# Patient Record
Sex: Female | Born: 1955
Health system: Southern US, Community
[De-identification: ages and names within clinical notes are randomized; demographics above are authoritative.]

## PROBLEM LIST (undated history)

## (undated) DIAGNOSIS — M6740 Ganglion, unspecified site: Secondary | ICD-10-CM

## (undated) DIAGNOSIS — H409 Unspecified glaucoma: Secondary | ICD-10-CM

## (undated) DIAGNOSIS — T7840XA Allergy, unspecified, initial encounter: Secondary | ICD-10-CM

## (undated) DIAGNOSIS — G43909 Migraine, unspecified, not intractable, without status migrainosus: Secondary | ICD-10-CM

## (undated) DIAGNOSIS — E785 Hyperlipidemia, unspecified: Secondary | ICD-10-CM

## (undated) DIAGNOSIS — M8580 Other specified disorders of bone density and structure, unspecified site: Secondary | ICD-10-CM

## (undated) HISTORY — DX: Ganglion, unspecified site: M67.40

## (undated) HISTORY — DX: Allergy, unspecified, initial encounter: T78.40XA

## (undated) HISTORY — DX: Unspecified glaucoma: H40.9

## (undated) HISTORY — DX: Migraine, unspecified, not intractable, without status migrainosus: G43.909

## (undated) HISTORY — DX: Hyperlipidemia, unspecified: E78.5

## (undated) HISTORY — PX: GANGLION CYST EXCISION: SHX1691

## (undated) HISTORY — DX: Other specified disorders of bone density and structure, unspecified site: M85.80

## (undated) HISTORY — PX: BREAST CYST ASPIRATION: SHX578

---

## 1999-12-01 ENCOUNTER — Other Ambulatory Visit: Admission: RE | Admit: 1999-12-01 | Discharge: 1999-12-01 | Payer: Self-pay | Admitting: Obstetrics and Gynecology

## 2000-01-27 ENCOUNTER — Ambulatory Visit (HOSPITAL_COMMUNITY): Admission: RE | Admit: 2000-01-27 | Discharge: 2000-01-27 | Payer: Self-pay | Admitting: Orthopedic Surgery

## 2000-12-07 ENCOUNTER — Other Ambulatory Visit: Admission: RE | Admit: 2000-12-07 | Discharge: 2000-12-07 | Payer: Self-pay | Admitting: Obstetrics and Gynecology

## 2001-12-20 ENCOUNTER — Other Ambulatory Visit: Admission: RE | Admit: 2001-12-20 | Discharge: 2001-12-20 | Payer: Self-pay | Admitting: Obstetrics and Gynecology

## 2002-12-05 ENCOUNTER — Other Ambulatory Visit: Admission: RE | Admit: 2002-12-05 | Discharge: 2002-12-05 | Payer: Self-pay | Admitting: Obstetrics and Gynecology

## 2003-12-13 ENCOUNTER — Other Ambulatory Visit: Admission: RE | Admit: 2003-12-13 | Discharge: 2003-12-13 | Payer: Self-pay | Admitting: Obstetrics and Gynecology

## 2006-08-19 ENCOUNTER — Ambulatory Visit: Payer: Self-pay | Admitting: Internal Medicine

## 2006-08-19 LAB — CONVERTED CEMR LAB
AST: 29 units/L (ref 0–37)
BUN: 19 mg/dL (ref 6–23)
Bilirubin Urine: NEGATIVE
Calcium: 9.8 mg/dL (ref 8.4–10.5)
Chloride: 99 meq/L (ref 96–112)
Chol/HDL Ratio, serum: 2.4
Cholesterol: 196 mg/dL (ref 0–200)
Creatinine, Ser: 0.9 mg/dL (ref 0.4–1.2)
Eosinophil percent: 3.6 % (ref 0.0–5.0)
HCT: 39.1 % (ref 36.0–46.0)
Hemoglobin, Urine: NEGATIVE
Hemoglobin: 12.8 g/dL (ref 12.0–15.0)
MCHC: 32.9 g/dL (ref 30.0–36.0)
MCV: 95.1 fL (ref 78.0–100.0)
Neutro Abs: 2.2 10*3/uL (ref 1.4–7.7)
Neutrophils Relative %: 48.8 % (ref 43.0–77.0)
RDW: 12.6 % (ref 11.5–14.6)
Total Bilirubin: 1.1 mg/dL (ref 0.3–1.2)
Triglyceride fasting, serum: 36 mg/dL (ref 0–149)
Urine Glucose: NEGATIVE mg/dL

## 2006-09-06 ENCOUNTER — Ambulatory Visit: Payer: Self-pay | Admitting: Internal Medicine

## 2006-10-01 ENCOUNTER — Ambulatory Visit: Payer: Self-pay | Admitting: Internal Medicine

## 2006-10-02 LAB — HM COLONOSCOPY: HM COLON: NORMAL

## 2006-10-26 ENCOUNTER — Ambulatory Visit: Payer: Self-pay | Admitting: Internal Medicine

## 2007-07-02 DIAGNOSIS — Z872 Personal history of diseases of the skin and subcutaneous tissue: Secondary | ICD-10-CM

## 2009-05-07 ENCOUNTER — Encounter: Payer: Self-pay | Admitting: Internal Medicine

## 2009-05-14 ENCOUNTER — Encounter: Payer: Self-pay | Admitting: Internal Medicine

## 2010-12-16 ENCOUNTER — Other Ambulatory Visit: Payer: BLUE CROSS/BLUE SHIELD

## 2010-12-16 ENCOUNTER — Other Ambulatory Visit (INDEPENDENT_AMBULATORY_CARE_PROVIDER_SITE_OTHER): Payer: BLUE CROSS/BLUE SHIELD | Admitting: Internal Medicine

## 2010-12-16 ENCOUNTER — Other Ambulatory Visit: Payer: Self-pay | Admitting: Internal Medicine

## 2010-12-16 ENCOUNTER — Other Ambulatory Visit (INDEPENDENT_AMBULATORY_CARE_PROVIDER_SITE_OTHER): Payer: BLUE CROSS/BLUE SHIELD

## 2010-12-16 DIAGNOSIS — Z0000 Encounter for general adult medical examination without abnormal findings: Secondary | ICD-10-CM

## 2010-12-16 DIAGNOSIS — Z13220 Encounter for screening for lipoid disorders: Secondary | ICD-10-CM

## 2010-12-16 LAB — URINALYSIS, ROUTINE W REFLEX MICROSCOPIC
Hgb urine dipstick: NEGATIVE
Specific Gravity, Urine: 1.02 (ref 1.000–1.030)
Urine Glucose: NEGATIVE
Urobilinogen, UA: 0.2 (ref 0.0–1.0)

## 2010-12-16 LAB — HEPATIC FUNCTION PANEL
ALT: 21 U/L (ref 0–35)
AST: 27 U/L (ref 0–37)
Bilirubin, Direct: 0.1 mg/dL (ref 0.0–0.3)
Total Bilirubin: 1.2 mg/dL (ref 0.3–1.2)

## 2010-12-16 LAB — CBC WITH DIFFERENTIAL/PLATELET
Basophils Absolute: 0 10*3/uL (ref 0.0–0.1)
Eosinophils Relative: 4.7 % (ref 0.0–5.0)
Lymphocytes Relative: 38.6 % (ref 12.0–46.0)
Lymphs Abs: 1.5 10*3/uL (ref 0.7–4.0)
Monocytes Relative: 10.6 % (ref 3.0–12.0)
Neutrophils Relative %: 45.6 % (ref 43.0–77.0)
Platelets: 317 10*3/uL (ref 150.0–400.0)
RDW: 13.7 % (ref 11.5–14.6)
WBC: 4 10*3/uL — ABNORMAL LOW (ref 4.5–10.5)

## 2010-12-16 LAB — LIPID PANEL
Cholesterol: 208 mg/dL — ABNORMAL HIGH (ref 0–200)
Total CHOL/HDL Ratio: 2
Triglycerides: 51 mg/dL (ref 0.0–149.0)
VLDL: 10.2 mg/dL (ref 0.0–40.0)

## 2010-12-16 LAB — BASIC METABOLIC PANEL
CO2: 31 mEq/L (ref 19–32)
Calcium: 9.4 mg/dL (ref 8.4–10.5)
Glucose, Bld: 81 mg/dL (ref 70–99)
Potassium: 4.6 mEq/L (ref 3.5–5.1)
Sodium: 140 mEq/L (ref 135–145)

## 2010-12-16 LAB — TSH: TSH: 2.27 u[IU]/mL (ref 0.35–5.50)

## 2010-12-22 ENCOUNTER — Ambulatory Visit (INDEPENDENT_AMBULATORY_CARE_PROVIDER_SITE_OTHER): Payer: BLUE CROSS/BLUE SHIELD | Admitting: Internal Medicine

## 2010-12-22 ENCOUNTER — Encounter: Payer: Self-pay | Admitting: Internal Medicine

## 2010-12-22 ENCOUNTER — Other Ambulatory Visit: Payer: Self-pay | Admitting: Internal Medicine

## 2010-12-22 VITALS — BP 100/60 | HR 70 | Temp 97.4°F | Wt 112.0 lb

## 2010-12-22 DIAGNOSIS — Z0000 Encounter for general adult medical examination without abnormal findings: Secondary | ICD-10-CM

## 2010-12-22 DIAGNOSIS — Z136 Encounter for screening for cardiovascular disorders: Secondary | ICD-10-CM

## 2010-12-22 MED ORDER — SUMATRIPTAN SUCCINATE 100 MG PO TABS: 100.0000 mg | ORAL_TABLET | Freq: Once | ORAL | Status: DC | PRN

## 2010-12-22 NOTE — Progress Notes (Signed)
Subjective:    Patient ID: Melinda Suarez, female    DOB: [birth year], 55 y.o.   MRN: [id number]  HPI Melinda Suarez presents for an routine medical exam. She was last seen in '07. In the interval she has been healthy. She had been followed by [facility] for gyn who has now retired. She has a life-long history of normal pelvic/pap exams. She has had routine DXA scans and has stable osteopenia. She has had normal Vit D level checked. She has had normal mammograms and is due in May. She had a colonoscopy at age 81 at [facility] which was normal.  Past Medical History  Diagnosis Date  . Ganglion cyst   . Migraine headache    No past surgical history on file. Melinda History  Problem Relation Age of Onset  . Osteoporosis Mother   . Arthritis Mother   . Asthma Brother   . Heart disease Paternal Grandfather   . Stroke Paternal Grandfather   . Diabetes Neg Hx   . Hyperlipidemia Neg Hx   . Hypertension Neg Hx   . Cancer Neg Hx    History   Social History  . Marital Status: Married    Spouse Name: N/A    Number of Children: 1  . Years of Education: 16   Occupational History  . 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 Smoking status: Never Smoker   . Smokeless tobacco: Not on file  . Alcohol Use: 2.5 oz/week    5 drink(s) per week  . Drug Use: Not on file  . Sexually Active: Yes -- Female partner(s)   Other Topics Concern  . Not on file   Social History Narrative   [facility]. Married '80 -  1 dtr - '96 - 10th grade ('12). Work - [employer]. Marriage is in good health. No history of abuse.        Review of Systems Review of Systems  Constitutional:  Negative for fever, chills, activity change and unexpected weight change.  HENT:  Negative for hearing loss, ear pain, congestion, neck stiffness and postnasal drip.   Eyes: Negative for pain, discharge and visual disturbance.  Respiratory: Negative for chest tightness and wheezing.    Cardiovascular: Negative for chest pain and palpitations.       [No decreased exercise tolerance Gastrointestinal: [No change in bowel habit. No bloating or gas. No reflux or indigestion Genitourinary: Negative for urgency, frequency, flank pain and difficulty urinating.  Musculoskeletal: Negative for myalgias, back pain, arthralgias and gait problem.  Neurological: Negative for dizziness, tremors, weakness and headaches.  Hematological: Negative for adenopathy.  Psychiatric/Behavioral: Negative for behavioral problems and dysphoric mood.       Objective:   Physical Exam Physical Exam  Constitutional: She is oriented to person, place, and time. Vital signs are normal. She appears well-developed and well-nourished.       Very pleasant  white woman in no distress  HENT:  Head: Normocephalic and atraumatic.  Right Ear: External ear normal.  Left Ear: External ear normal.  EACs and TMs normal  Eyes: Conjunctivae and EOM are normal. Pupils are equal, round, and reactive to light. No scleral icterus.  Neck: Normal range of motion. Neck supple. No JVD present. No thyromegaly present.  Cardiovascular: Normal rate, regular rhythm and normal heart sounds.  Exam reveals no friction rub.   No murmur heard.      Radial and Dorsalis Pedis pulses  normal  Pulmonary/Chest: Effort normal and breath sounds normal. No respiratory distress. She has no wheezes. She has no rales.       No chest wall deformity with normal A-P diameter. Breast exam: Deferred to gyn - Sept '11  Abdominal: Soft. Bowel sounds are normal. She exhibits no distension and no mass. There is no tenderness. There is no guarding.       No hepatosplenomegaly  Genitourinary:       Exam deferred to gyn - Sept '11 Musculoskeletal: Normal range of motion. She exhibits no edema and no tenderness.       No joint swelling, no synovial thickening, no deformity of the small, medium or large joints.  Lymphadenopathy:    She has no cervical  adenopathy.  Neurological: She is alert and oriented to person, place, and time. She has normal reflexes. No cranial nerve deficit. Coordination normal.       Normal facial symmetry, normal gross motor strength throughout, no tremor or cogwheeling, normal rapid finger movement.  Skin: Skin is warm and dry. No rash noted. No erythema.       No suspicious lesions. Normal turgor. Nails are normal.  Psychiatric: She has a normal mood and affect. Her behavior is normal. Thought content normal.       Normal recall   Lab Results  Component Value Date   WBC 4.0* 12/16/2010   HGB 12.5 12/16/2010   HCT 36.0 12/16/2010   PLT 317.0 12/16/2010   CHOL 208* 12/16/2010   TRIG 51.0 12/16/2010   HDL 86.70 12/16/2010   LDLDIRECT 95.7 12/16/2010   ALT 21 12/16/2010   AST 27 12/16/2010   NA 140 12/16/2010   K 4.6 12/16/2010   CL 103 12/16/2010   CREATININE 0.7 12/16/2010   BUN 20 12/16/2010   CO2 31 12/16/2010   TSH 2.27 12/16/2010          Assessment & Plan:  1. Wellness exam- a very healthy woman with a normal physical exam. Her lab results are all normal with a very good and protective Lipid profile. She is current with Tdap. She had a colonoscopy at age 34 and will be due for follow-up study at age 68. She is current with gyn and breast health as noted. 12 lead EKG reveals right bundle branch block with an RSR' pattern in V1-V2 with associated T-wave inversion. She is directed to [email or web address] for further information should she be curious.  In summary - a very healthy woman who is medically stable. She has osteopenia by previous DXA scans and will be due for follow-up study in September '12. She is advised to consume 1200mg  daily of calcium and [phone] iu Vit D daily. She will return in 1 year (may change her annual exam to September if she desires) or as needed.

## 2011-02-04 ENCOUNTER — Encounter: Payer: Self-pay | Admitting: Internal Medicine

## 2011-06-01 LAB — HM PAP SMEAR

## 2012-08-02 ENCOUNTER — Other Ambulatory Visit: Payer: Self-pay | Admitting: Internal Medicine

## 2013-02-03 LAB — HM MAMMOGRAPHY: HM MAMMO: NORMAL

## 2013-02-03 LAB — HM DEXA SCAN

## 2013-03-21 ENCOUNTER — Encounter: Payer: Self-pay | Admitting: Internal Medicine

## 2014-02-12 LAB — HM MAMMOGRAPHY: HM Mammogram: NORMAL

## 2014-06-04 ENCOUNTER — Encounter: Payer: Self-pay | Admitting: Family Medicine

## 2014-06-04 ENCOUNTER — Ambulatory Visit (INDEPENDENT_AMBULATORY_CARE_PROVIDER_SITE_OTHER): Payer: BLUE CROSS/BLUE SHIELD | Admitting: Family Medicine

## 2014-06-04 VITALS — BP 100/72 | HR 56 | Temp 97.5°F | Wt 112.0 lb

## 2014-06-04 DIAGNOSIS — G43C0 Periodic headache syndromes in child or adult, not intractable: Secondary | ICD-10-CM

## 2014-06-04 DIAGNOSIS — G43909 Migraine, unspecified, not intractable, without status migrainosus: Secondary | ICD-10-CM | POA: Insufficient documentation

## 2014-06-04 DIAGNOSIS — M810 Age-related osteoporosis without current pathological fracture: Secondary | ICD-10-CM | POA: Insufficient documentation

## 2014-06-04 DIAGNOSIS — Z0000 Encounter for general adult medical examination without abnormal findings: Secondary | ICD-10-CM

## 2014-06-04 DIAGNOSIS — IMO0001 Reserved for inherently not codable concepts without codable children: Secondary | ICD-10-CM

## 2014-06-04 DIAGNOSIS — H409 Unspecified glaucoma: Secondary | ICD-10-CM | POA: Insufficient documentation

## 2014-06-04 MED ORDER — SUMATRIPTAN SUCCINATE 100 MG PO TABS: ORAL_TABLET | ORAL | Status: DC

## 2014-06-04 NOTE — Assessment & Plan Note (Signed)
Well controlled with as needed Imitrex. Continue

## 2014-06-04 NOTE — Patient Instructions (Signed)
Return for fasting labs at your convenience over the next few weeks.   I would like to see you at least every 2-3 years unless problems come up which I hope they don't.   Flu shot today.   Medical Records-let's get immunizations, pap smear, dexa bone density report for the last 10 years.   Refilled imitrex

## 2014-06-04 NOTE — Progress Notes (Signed)
Melinda Reddish, MD Phone: [phone]  Subjective:  Patient presents today to establish care with me as their new primary care provider. Patient was formerly a patient of Dr. Linda Hedges. Chief complaint-noted.   Migraines-controlled Sparing use of imitrex sometimes once every 6 months at other times a couple times a month. ROS-no blurry vision, no extremity weakness  Regular exercise-walking everyday for 30 minutes. Does stairs at [employer] 6 flights every hour. Can walk 17 flights. Has ob/gyn-Dr. Pamala Hurry at [facility]. Does mammograms usually in spring, pap smears, dexa every 2 years- osteopenia which progressed to osteoporosis. On Fosamax year 3/5  The following were reviewed and entered/updated in epic: Past Medical History  Diagnosis Date  . Ganglion cyst   . Migraine headache    Patient Active Problem List   Diagnosis Date Noted  . Osteoporosis 06/04/2014    Priority: Medium  . Migraines 06/04/2014    Priority: Medium  . Glaucoma 06/04/2014    Priority: Low  . History of disease of skin and subcutaneous tissue 07/02/2007    Priority: Low   Past Surgical History  Procedure Laterality Date  . Ganglion cyst excision      left hand; left handed    Family History  Problem Relation Age of Onset  . Osteoporosis Mother   . Arthritis Mother   . Asthma Brother   . Heart disease Paternal Grandfather     32s  . Stroke Paternal Grandfather   . Diabetes Neg Hx   . Hyperlipidemia Neg Hx   . Hypertension Neg Hx   . Cancer Neg Hx   . Kidney disease Father     amylodosis, died in 70s. Tear of aorta and was on blood thinners.     Medications- reviewed and updated Current Outpatient Prescriptions  Medication Sig Dispense Refill  . alendronate (FOSAMAX) 70 MG tablet Take 70 mg by mouth once a week. Take with a full glass of water on an empty stomach.      . Multiple Vitamins-Minerals (MULTIVITAMIN,TX-MINERALS) tablet Take 1 tablet by mouth daily.        . SUMAtriptan  (IMITREX) 100 MG tablet TAKE 1 TABLET AS NEEDED FOR MIGRAINE HEADACHE AS DIRECTED.  8 tablet  11  . Bimatoprost (LUMIGAN) 0.01 % SOLN Apply 1 drop to eye daily. Both eyes       . Calcium Carbonate-Vitamin D (CALCIUM + D PO)         No current facility-administered medications for this visit.    Allergies-reviewed and updated No Known Allergies  History   Social History  . Marital Status: Married    Spouse Name: N/A    Number of Children: 1  . Years of Education: 16   Occupational History  . 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 Smoking status: Never Smoker   . Smokeless tobacco: None  . Alcohol Use: 3.5 oz/week    7 drink(s) per week  . Drug Use: No  . Sexual Activity: Yes    Partners: Male   Other Topics Concern  . None   Social History Narrative   Married 1980. 1 dtr - '96 - freshman ('15) has some mental health issues but living at home and going to [facility].       [place]. Business. Engineer, maintenance (IT) for industry.       Hobbies: hike, walking, former runner, crafts, Firefighter, house in [place] with mother. Baking.     ROS--See HPI  Objective: BP 100/72  Pulse 56  Temp(Src) 97.5 F (36.4 C)  Wt 112 lb (50.803 kg) Gen: NAD, resting comfortably, appears younger than stated age HEENT: Mucous membranes are moist. Oropharynx normal. Good dentition CV: RRR no murmurs rubs or gallops Lungs: CTAB no crackles, wheeze, rhonchi Abdomen: soft/nontender/nondistended/normal bowel sounds. No rebound or guarding.  Ext: no edema Skin: warm, dry Neuro: grossly normal, moves all extremities, PERRLA  Assessment/Plan:  Migraines Well controlled with as needed Imitrex. Continue   Return for fasting labs.  Orders Placed This Encounter  Procedures  . CBC    [facility]    Standing Status: Future     Number of Occurrences:      Standing Expiration Date: 07/30/2014  . Comprehensive metabolic panel    [facility]    Standing Status: Future      Number of Occurrences:      Standing Expiration Date: 07/30/2014  . Lipid panel    [facility]    Standing Status: Future     Number of Occurrences:      Standing Expiration Date: 07/30/2014  . TSH    [facility]    Standing Status: Future     Number of Occurrences:      Standing Expiration Date: 07/30/2014    Meds ordered this encounter  Medications  . alendronate (FOSAMAX) 70 MG tablet    Sig: Take 70 mg by mouth once a week. Take with a full glass of water on an empty stomach.  . SUMAtriptan (IMITREX) 100 MG tablet    Sig: TAKE 1 TABLET AS NEEDED FOR MIGRAINE HEADACHE AS DIRECTED.    Dispense:  8 tablet    Refill:  11   From a after visit summary Medical Records-let's get immunizations, pap smear, dexa bone density report for the last 10 years.

## 2014-06-13 ENCOUNTER — Encounter: Payer: Self-pay | Admitting: Family Medicine

## 2014-06-13 DIAGNOSIS — A6000 Herpesviral infection of urogenital system, unspecified: Secondary | ICD-10-CM

## 2014-06-13 DIAGNOSIS — N63 Unspecified lump in unspecified breast: Secondary | ICD-10-CM | POA: Insufficient documentation

## 2014-06-18 LAB — HM PAP SMEAR: HM Pap smear: NORMAL

## 2014-07-16 ENCOUNTER — Other Ambulatory Visit (INDEPENDENT_AMBULATORY_CARE_PROVIDER_SITE_OTHER): Payer: BLUE CROSS/BLUE SHIELD

## 2014-07-16 DIAGNOSIS — IMO0001 Reserved for inherently not codable concepts without codable children: Secondary | ICD-10-CM

## 2014-07-16 DIAGNOSIS — Z0000 Encounter for general adult medical examination without abnormal findings: Secondary | ICD-10-CM

## 2014-07-16 LAB — CBC
HCT: 39.3 % (ref 36.0–46.0)
HEMOGLOBIN: 13 g/dL (ref 12.0–15.0)
MCHC: 33 g/dL (ref 30.0–36.0)
MCV: 94.4 fl (ref 78.0–100.0)
PLATELETS: 376 10*3/uL (ref 150.0–400.0)
RBC: 4.17 Mil/uL (ref 3.87–5.11)
RDW: 14.3 % (ref 11.5–15.5)
WBC: 4.5 10*3/uL (ref 4.0–10.5)

## 2014-07-16 LAB — LIPID PANEL
Cholesterol: 213 mg/dL — ABNORMAL HIGH (ref 0–200)
HDL: 89.2 mg/dL (ref >39.00)
LDL Cholesterol: 115 mg/dL — ABNORMAL HIGH (ref 0–99)
NonHDL: 123.8
TRIGLYCERIDES: 45 mg/dL (ref 0.0–149.0)
Total CHOL/HDL Ratio: 2
VLDL: 9 mg/dL (ref 0.0–40.0)

## 2014-07-16 LAB — COMPREHENSIVE METABOLIC PANEL
ALT: 23 U/L (ref 0–35)
AST: 32 U/L (ref 0–37)
Albumin: 4.6 g/dL (ref 3.5–5.2)
Alkaline Phosphatase: 53 U/L (ref 39–117)
BUN: 20 mg/dL (ref 6–23)
CHLORIDE: 103 meq/L (ref 96–112)
CO2: 26 mEq/L (ref 19–32)
CREATININE: 0.8 mg/dL (ref 0.4–1.2)
Calcium: 9.4 mg/dL (ref 8.4–10.5)
GFR: 79.25 mL/min (ref >60.00)
Glucose, Bld: 85 mg/dL (ref 70–99)
Potassium: 5 mEq/L (ref 3.5–5.1)
Sodium: 141 mEq/L (ref 135–145)
Total Bilirubin: 0.6 mg/dL (ref 0.2–1.2)
Total Protein: 7.7 g/dL (ref 6.0–8.3)

## 2014-07-18 ENCOUNTER — Encounter: Payer: Self-pay | Admitting: Family Medicine

## 2014-07-18 DIAGNOSIS — E785 Hyperlipidemia, unspecified: Secondary | ICD-10-CM | POA: Insufficient documentation

## 2014-07-18 LAB — TSH: TSH: 3.59 u[IU]/mL (ref 0.35–4.50)

## 2015-06-13 ENCOUNTER — Encounter: Payer: Self-pay | Admitting: Family Medicine

## 2015-06-13 ENCOUNTER — Ambulatory Visit (INDEPENDENT_AMBULATORY_CARE_PROVIDER_SITE_OTHER): Payer: BC Managed Care – PPO | Admitting: Family Medicine

## 2015-06-13 VITALS — BP 90/62 | HR 80 | Temp 98.4°F | Ht 65.0 in | Wt 111.0 lb

## 2015-06-13 DIAGNOSIS — Z23 Encounter for immunization: Secondary | ICD-10-CM | POA: Diagnosis not present

## 2015-06-13 DIAGNOSIS — D485 Neoplasm of uncertain behavior of skin: Secondary | ICD-10-CM | POA: Diagnosis not present

## 2015-06-13 DIAGNOSIS — Z0000 Encounter for general adult medical examination without abnormal findings: Secondary | ICD-10-CM

## 2015-06-13 MED ORDER — SUMATRIPTAN SUCCINATE 100 MG PO TABS: ORAL_TABLET | ORAL | Status: DC

## 2015-06-13 NOTE — Patient Instructions (Addendum)
Flu shot received today.  Sign release of information at the front desk for [facility]/Dr. Gareth Eagle for your mammogram  We will call you within a week about your referral to dermatology. If you do not hear within 2 weeks, give [place] a call.   OK for daughter to be seen within next few months- Christmas break would be fine. Not during Christmas week or the Friday before.   1 year unless you need [place] sooner

## 2015-06-13 NOTE — Progress Notes (Signed)
Melinda Reddish, MD Phone: [phone]  Subjective:  Patient presents today for their annual physical. Chief complaint-noted.   -doing very well.  ROS- full  review of systems was completed and negative except for 2 skin lesions she is concerned about- wants to see dermatology  The following were reviewed and entered/updated in epic: Past Medical History  Diagnosis Date  . Ganglion cyst   . Migraine headache    Patient Active Problem List   Diagnosis Date Noted  . Hyperlipidemia 07/18/2014    Priority: Medium  . Osteoporosis 06/04/2014    Priority: Medium  . Migraines 06/04/2014    Priority: Medium  . Genital herpes 06/13/2014    Priority: Low  . Glaucoma 06/04/2014    Priority: Low  . History of disease of skin and subcutaneous tissue 07/02/2007    Priority: Low   Past Surgical History  Procedure Laterality Date  . Ganglion cyst excision      left hand; left handed  . Breast cyst aspiration      benign in 2011    Family History  Problem Relation Age of Onset  . Osteoporosis Mother   . Arthritis Mother   . Asthma Brother   . Heart disease Paternal Grandfather     66s  . Stroke Paternal Grandfather   . Diabetes Neg Hx   . Hyperlipidemia Neg Hx   . Hypertension Neg Hx   . Cancer Neg Hx   . Kidney disease Father     amylodosis, died in 11s. Tear of aorta and was on blood thinners.     Medications- reviewed and updated Current Outpatient Prescriptions  Medication Sig Dispense Refill  . alendronate (FOSAMAX) 70 MG tablet Take 70 mg by mouth once a week. Take with a full glass of water on an empty stomach.    . Bimatoprost (LUMIGAN) 0.01 % SOLN Apply 1 drop to eye daily. Both eyes     . Calcium Carbonate-Vitamin D (CALCIUM + D PO)      . Multiple Vitamins-Minerals (MULTIVITAMIN,TX-MINERALS) tablet Take 1 tablet by mouth daily.      . SUMAtriptan (IMITREX) 100 MG tablet TAKE 1 TABLET AS NEEDED FOR MIGRAINE HEADACHE AS DIRECTED. 8 tablet 11   No current  facility-administered medications for this visit.    Allergies-reviewed and updated Allergies  Allergen Reactions  . Cephalosporins   . Sulfur     Social History   Social History  . Marital Status: Married    Spouse Name: N/A  . Number of Children: 1  . Years of Education: 16   Occupational History  . 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 Smoking status: Never Smoker   . Smokeless tobacco: None  . Alcohol Use: 3.5 oz/week    7 drink(s) per week  . Drug Use: No  . Sexual Activity:    Partners: Male   Other Topics Concern  . None   Social History Narrative   Married 1980. 1 dtr - '96 - freshman ('15) has some mental health issues but living at home and going to [employer] [facility].       [place]. Business. Engineer, maintenance (IT) for industry.       Hobbies: hike, walking, former runner, crafts, Firefighter, house in [place] with mother. Baking.     ROS--See HPI   Objective: BP 90/62 mmHg  Pulse 80  Temp(Src) 98.4 F (36.9 C)  Ht 5\' 5"  (1.651 m)  Wt 111 lb (50.349  kg)  BMI 18.47 kg/m2 Gen: NAD, resting comfortably HEENT: Mucous membranes are moist. Oropharynx normal Neck: no thyromegaly CV: RRR no murmurs rubs or gallops Lungs: CTAB no crackles, wheeze, rhonchi Abdomen: soft/nontender/nondistended/normal bowel sounds. No rebound or guarding.  Breast and GYn exam through Dr. Pamala Hurry Ext: no edema Skin: warm, dry Neuro: grossly normal, moves all extremities, PERRLA  Assessment/Plan:  59 y.o. female presenting for annual physical.  Health Maintenance counseling: 1. Anticipatory guidance: Patient counseled regarding regular dental exams, regular eye exams, wearing seatbelts, wearing sunscreen 2. Risk factor reduction:  Advised patient of need for regular exercise and diet rich and fruits and vegetables to reduce risk of heart attack and stroke.  3. Immunizations/screenings/ancillary studies Health Maintenance Due  Topic Date Due  . Hepatitis  C Screening-declined 02[birth year]25[birth year]56  . HIV Screening already screened 10/28/1970  . PAP SMEAR - pap 06/18/14 negative for malignancy or premalignant lesion 05/31/2014  . INFLUENZA VACCINE - today 04/01/2015  4. Cervical cancer screening- pap 06/18/14 negative for malignancy or premalignant lesion 5. Breast cancer screening-  breast exam with GYN and mammogram within 6 months 6. Colon cancer screening - 2008 with 10 year repeat in February 2018 7. Skin cancer screening- will refer to dermatology- has 2 lesions that may be benign but have some recent change. Left lip, left shoulder  Osteoporosis- compliant with Fosamax since 2012, DEXA 2014 through [facility]. Bone density later this year Migraines- rare imitrex, HA vary from 2-3 a month to 2x a year  1 year CPE  Orders Placed This Encounter  Procedures  . Flu Vaccine QUAD 36+ mos IM  . Ambulatory referral to Dermatology    Referral Priority:  Routine    Referral Type:  Consultation    Referral Reason:  Specialty Services Required    Requested Specialty:  Dermatology    Number of Visits Requested:  1   Meds ordered this encounter  Medications  . SUMAtriptan (IMITREX) 100 MG tablet    Sig: TAKE 1 TABLET AS NEEDED FOR MIGRAINE HEADACHE AS DIRECTED.    Dispense:  8 tablet    Refill:  11

## 2015-06-14 ENCOUNTER — Other Ambulatory Visit (INDEPENDENT_AMBULATORY_CARE_PROVIDER_SITE_OTHER): Payer: BC Managed Care – PPO

## 2015-06-14 DIAGNOSIS — Z0000 Encounter for general adult medical examination without abnormal findings: Secondary | ICD-10-CM

## 2015-06-14 LAB — POCT URINALYSIS DIPSTICK
BILIRUBIN UA: NEGATIVE
GLUCOSE UA: NEGATIVE
Ketones, UA: NEGATIVE
Leukocytes, UA: NEGATIVE
NITRITE UA: NEGATIVE
Protein, UA: NEGATIVE
RBC UA: NEGATIVE
SPEC GRAV UA: 1.02
Urobilinogen, UA: 0.2
pH, UA: 6.5

## 2015-06-14 LAB — CBC WITH DIFFERENTIAL/PLATELET
BASOS ABS: 0 10*3/uL (ref 0.0–0.1)
Basophils Relative: 0.6 % (ref 0.0–3.0)
Eosinophils Absolute: 0.3 10*3/uL (ref 0.0–0.7)
Eosinophils Relative: 4.1 % (ref 0.0–5.0)
HEMATOCRIT: 39.6 % (ref 36.0–46.0)
HEMOGLOBIN: 13.1 g/dL (ref 12.0–15.0)
LYMPHS PCT: 25.7 % (ref 12.0–46.0)
Lymphs Abs: 1.7 10*3/uL (ref 0.7–4.0)
MCHC: 33.1 g/dL (ref 30.0–36.0)
MCV: 94.6 fl (ref 78.0–100.0)
MONOS PCT: 10.8 % (ref 3.0–12.0)
Monocytes Absolute: 0.7 10*3/uL (ref 0.1–1.0)
Neutro Abs: 3.9 10*3/uL (ref 1.4–7.7)
Neutrophils Relative %: 58.8 % (ref 43.0–77.0)
Platelets: 354 10*3/uL (ref 150.0–400.0)
RBC: 4.19 Mil/uL (ref 3.87–5.11)
RDW: 13.3 % (ref 11.5–15.5)
WBC: 6.6 10*3/uL (ref 4.0–10.5)

## 2015-06-14 LAB — HEPATIC FUNCTION PANEL
ALBUMIN: 4.3 g/dL (ref 3.5–5.2)
ALK PHOS: 53 U/L (ref 39–117)
ALT: 24 U/L (ref 0–35)
AST: 27 U/L (ref 0–37)
Bilirubin, Direct: 0.1 mg/dL (ref 0.0–0.3)
TOTAL PROTEIN: 7.6 g/dL (ref 6.0–8.3)
Total Bilirubin: 0.6 mg/dL (ref 0.2–1.2)

## 2015-06-14 LAB — LIPID PANEL
CHOL/HDL RATIO: 2
Cholesterol: 186 mg/dL (ref 0–200)
HDL: 79.4 mg/dL (ref >39.00)
LDL CALC: 94 mg/dL (ref 0–99)
NonHDL: 106.45
TRIGLYCERIDES: 62 mg/dL (ref 0.0–149.0)
VLDL: 12.4 mg/dL (ref 0.0–40.0)

## 2015-06-14 LAB — BASIC METABOLIC PANEL
BUN: 20 mg/dL (ref 6–23)
CALCIUM: 9.5 mg/dL (ref 8.4–10.5)
CO2: 32 mEq/L (ref 19–32)
CREATININE: 0.88 mg/dL (ref 0.40–1.20)
Chloride: 99 mEq/L (ref 96–112)
GFR: 69.75 mL/min (ref >60.00)
GLUCOSE: 84 mg/dL (ref 70–99)
Potassium: 4.9 mEq/L (ref 3.5–5.1)
Sodium: 139 mEq/L (ref 135–145)

## 2015-06-14 LAB — TSH: TSH: 2.32 u[IU]/mL (ref 0.35–4.50)

## 2015-09-05 ENCOUNTER — Telehealth: Payer: Self-pay | Admitting: Family Medicine

## 2015-09-05 NOTE — Telephone Encounter (Signed)
Patient Name: Melinda Suarez  DOB: 03-19-1956    Initial Comment caller states she is still breaking out in hives and a rash after taking abx   Nurse Assessment  Nurse: Mallie Mussel, RN, Alveta Heimlich Date/Time Eilene Ghazi Time): 09/05/2015 8:44:24 AM  Confirm and document reason for call. If symptomatic, describe symptoms. ---Caller states that she was on an Amoxicillin for strep. She began breaking out in a rash. She was diagnosed as allergic to PCN. She was put on Prednisone. The rash began getting better. On the 4th day, she began getting a new rash. The rash continued to spread then began getting better. Now its getting worse again. The new rash looks the same as the hives she was diagnosed with earlier and it is itchy which is moderate. Denies difficulty breathing and denies fever. She stopped the Prednisone on 08/26/15. She has not been taking Benadryl due to glaucoma, but she has been taking Claritin which hasn't helped with the itching.  Has the patient traveled out of the country within the last 30 days? ---No  Does the patient have any new or worsening symptoms? ---Yes  Will a triage be completed? ---Yes  Related visit to physician within the last 2 weeks? ---No  Does the PT have any chronic conditions? (i.e. diabetes, asthma, etc.) ---Yes  List chronic conditions. ---Glaucoma, Osteoporosis  Is this a behavioral health or substance abuse call? ---No     Guidelines    Guideline Title Affirmed Question Affirmed Notes  Hives [1] MODERATE-SEVERE hives persist (i.e., hives interfere with normal activities or work) AND [2] taking antihistamine (e.g., Benadryl, Claritin) > 24 hours    Final Disposition User   See Physician within [facility], RN, Alveta Heimlich    Comments  Appointment scheduled for tomorrow at 11:30a   Referrals  REFERRED TO PCP OFFICE   Disagree/Comply: Leta Baptist

## 2015-09-05 NOTE — Telephone Encounter (Signed)
noted 

## 2015-09-06 ENCOUNTER — Ambulatory Visit (INDEPENDENT_AMBULATORY_CARE_PROVIDER_SITE_OTHER): Payer: BC Managed Care – PPO | Admitting: Family Medicine

## 2015-09-06 ENCOUNTER — Encounter: Payer: Self-pay | Admitting: Family Medicine

## 2015-09-06 VITALS — BP 100/60 | HR 80 | Wt 112.0 lb

## 2015-09-06 DIAGNOSIS — T7840XA Allergy, unspecified, initial encounter: Secondary | ICD-10-CM | POA: Diagnosis not present

## 2015-09-06 DIAGNOSIS — R21 Rash and other nonspecific skin eruption: Secondary | ICD-10-CM

## 2015-09-06 NOTE — Progress Notes (Signed)
Garret Reddish, MD  Subjective:  Melinda Suarez is a 60 y.o. year old very pleasant female patient who presents for/with See problem oriented charting ROS-not ill appearing, no fever/chills. Has taken new medicine which is likely culprit. Not immunocompromised. No mucus membrane involvement. No lip or tongue swelling. No shortness of breath.   Past Medical History-  Patient Active Problem List   Diagnosis Date Noted  . Hyperlipidemia 07/18/2014    Priority: Medium  . Osteoporosis 06/04/2014    Priority: Medium  . Migraines 06/04/2014    Priority: Medium  . Genital herpes 06/13/2014    Priority: Low  . Glaucoma 06/04/2014    Priority: Low  . History of disease of skin and subcutaneous tissue 07/02/2007    Priority: Low    Medications- reviewed and updated Current Outpatient Prescriptions  Medication Sig Dispense Refill  . alendronate (FOSAMAX) 70 MG tablet Take 70 mg by mouth once a week. Take with a full glass of water on an empty stomach.    . Bimatoprost (LUMIGAN) 0.01 % SOLN Apply 1 drop to eye daily. Both eyes     . Calcium Carbonate-Vitamin D (CALCIUM + D PO)      . Multiple Vitamins-Minerals (MULTIVITAMIN,TX-MINERALS) tablet Take 1 tablet by mouth daily.      . SUMAtriptan (IMITREX) 100 MG tablet TAKE 1 TABLET AS NEEDED FOR MIGRAINE HEADACHE AS DIRECTED. 8 tablet 11   No current facility-administered medications for this visit.    Objective: BP 100/60 mmHg  Pulse 80  Wt 112 lb (50.803 kg) Gen: NAD, resting comfortably No lip or tongue swelling CV: RRR no murmurs rubs or gallops Lungs: CTAB no crackles, wheeze, rhonchi Abdomen: soft/nontender/nondistended/normal bowel sounds. No rebound or guarding.  Ext: no edema Skin: warm, dry She has mild erythematous macules throughout her body from neck down to legs sparing palms and soles. No hives noted.  Neuro: grossly normal, moves all extremities  Assessment/Plan:  S:Went to Urgent care for strep throat. Treated  with amoxicillin. She soon developed a rash from her neck down essentially. Went back in and on December 23rd started Prednisone  (20mg  5 days) plus given depo medrol. She developed a new rash with pruritic hives. She Took 4 prednisone and stopped due to worsening rash. Claritin has helped with itch mildly. Has not taken Benadryl no due to glaucoma. Describes current rash as "everywhere neck down" Over last day or two it seems to be lightening and itch less intense. It has been over 10 days since she stopped the medicines. She has erythematous patches. No lip or tongue swelling A/P:Rash/Allergic reaction- added penicillin to allergies under rash. She had not told urgent care about cephalosporin allergy. Added depo medrol and prednisone to allergy as hives (previously raised wheals- now just macules). Given duration of symptoms, I suspect that the lingering depo-medrol is the real culprit here. We discussed allergist referral vs. Waiting 5 weeks for depo to fully be out of system. She would ike to wait. We called Dr. Geryl Rankins optho. Patient does not have closed angle glaucoma and may take benadryl  if she would  like. We will call her with this information next week.    If symptoms persist at 5 weeks- agrees to call for allergist referral Return precautions advised.

## 2015-09-06 NOTE — Patient Instructions (Signed)
Rash likely due to depo-medrol Takes 1-5 weeks to fully clear out of system. Would consider allergist if 4-5 weeks from injection you are still having rash.   If any tongue swelling, shortness of breath, throat swelling develop we need to see you immediately  We called eye doctor and if they call us back we will let you know if benadryl is ok for you. Topical benadryl should be ok but would use on small area first to make sure no reaction

## 2016-03-05 LAB — HM MAMMOGRAPHY

## 2016-07-16 LAB — HM DEXA SCAN: HM Dexa Scan: UNDETERMINED

## 2016-08-13 ENCOUNTER — Other Ambulatory Visit (INDEPENDENT_AMBULATORY_CARE_PROVIDER_SITE_OTHER): Payer: BC Managed Care – PPO

## 2016-08-13 DIAGNOSIS — Z0000 Encounter for general adult medical examination without abnormal findings: Secondary | ICD-10-CM | POA: Diagnosis not present

## 2016-08-13 LAB — POC URINALSYSI DIPSTICK (AUTOMATED)
BILIRUBIN UA: NEGATIVE
Blood, UA: NEGATIVE
Glucose, UA: NEGATIVE
KETONES UA: NEGATIVE
NITRITE UA: NEGATIVE
Spec Grav, UA: 1.015
Urobilinogen, UA: 0.2
pH, UA: 7

## 2016-08-13 LAB — TSH: TSH: 2.48 u[IU]/mL (ref 0.35–4.50)

## 2016-08-13 LAB — LIPID PANEL
CHOLESTEROL: 221 mg/dL — AB (ref 0–200)
HDL: 98.9 mg/dL (ref >39.00)
LDL Cholesterol: 111 mg/dL — ABNORMAL HIGH (ref 0–99)
NONHDL: 122.54
Total CHOL/HDL Ratio: 2
Triglycerides: 59 mg/dL (ref 0.0–149.0)
VLDL: 11.8 mg/dL (ref 0.0–40.0)

## 2016-08-13 LAB — HEPATIC FUNCTION PANEL
ALBUMIN: 4.7 g/dL (ref 3.5–5.2)
ALT: 23 U/L (ref 0–35)
AST: 27 U/L (ref 0–37)
Alkaline Phosphatase: 56 U/L (ref 39–117)
Bilirubin, Direct: 0.1 mg/dL (ref 0.0–0.3)
TOTAL PROTEIN: 7.2 g/dL (ref 6.0–8.3)
Total Bilirubin: 0.6 mg/dL (ref 0.2–1.2)

## 2016-08-13 LAB — BASIC METABOLIC PANEL
BUN: 15 mg/dL (ref 6–23)
CO2: 35 meq/L — AB (ref 19–32)
Calcium: 9.6 mg/dL (ref 8.4–10.5)
Chloride: 101 mEq/L (ref 96–112)
Creatinine, Ser: 0.85 mg/dL (ref 0.40–1.20)
GFR: 72.32 mL/min (ref >60.00)
GLUCOSE: 79 mg/dL (ref 70–99)
POTASSIUM: 4.9 meq/L (ref 3.5–5.1)
SODIUM: 140 meq/L (ref 135–145)

## 2016-08-14 LAB — CBC WITH DIFFERENTIAL/PLATELET
Basophils Absolute: 0 10*3/uL (ref 0.0–0.1)
Basophils Relative: 0.6 % (ref 0.0–3.0)
EOS PCT: 9.5 % — AB (ref 0.0–5.0)
Eosinophils Absolute: 0.4 10*3/uL (ref 0.0–0.7)
HCT: 38.2 % (ref 36.0–46.0)
Hemoglobin: 13 g/dL (ref 12.0–15.0)
LYMPHS ABS: 1.6 10*3/uL (ref 0.7–4.0)
LYMPHS PCT: 38.3 % (ref 12.0–46.0)
MCHC: 33.9 g/dL (ref 30.0–36.0)
MCV: 93.7 fl (ref 78.0–100.0)
MONOS PCT: 10.1 % (ref 3.0–12.0)
Monocytes Absolute: 0.4 10*3/uL (ref 0.1–1.0)
NEUTROS PCT: 41.5 % — AB (ref 43.0–77.0)
Neutro Abs: 1.7 10*3/uL (ref 1.4–7.7)
Platelets: 334 10*3/uL (ref 150.0–400.0)
RBC: 4.08 Mil/uL (ref 3.87–5.11)
RDW: 13.9 % (ref 11.5–15.5)
WBC: 4.1 10*3/uL (ref 4.0–10.5)

## 2016-08-20 ENCOUNTER — Ambulatory Visit (INDEPENDENT_AMBULATORY_CARE_PROVIDER_SITE_OTHER): Payer: BC Managed Care – PPO | Admitting: Family Medicine

## 2016-08-20 ENCOUNTER — Encounter: Payer: Self-pay | Admitting: Family Medicine

## 2016-08-20 VITALS — BP 100/72 | HR 72 | Temp 97.6°F | Ht 64.75 in | Wt 108.4 lb

## 2016-08-20 DIAGNOSIS — F32A Depression, unspecified: Secondary | ICD-10-CM

## 2016-08-20 DIAGNOSIS — F320 Major depressive disorder, single episode, mild: Secondary | ICD-10-CM

## 2016-08-20 DIAGNOSIS — Z0000 Encounter for general adult medical examination without abnormal findings: Secondary | ICD-10-CM | POA: Diagnosis not present

## 2016-08-20 DIAGNOSIS — Z636 Dependent relative needing care at home: Secondary | ICD-10-CM | POA: Insufficient documentation

## 2016-08-20 MED ORDER — SUMATRIPTAN SUCCINATE 100 MG PO TABS: ORAL_TABLET | ORAL | 11 refills | Status: DC

## 2016-08-20 NOTE — Progress Notes (Signed)
Phone: [phone]  Subjective:  Patient presents today for their annual physical. Chief complaint-noted.   See problem oriented charting- ROS- full  review of systems was completed and negative except for: depressed mood, anhedonia, poor energy, trouble with focus.   The following were reviewed and entered/updated in epic: Past Medical History:  Diagnosis Date  . Ganglion cyst   . Migraine headache    Patient Active Problem List   Diagnosis Date Noted  . Mild depression ([place]) 08/20/2016    Priority: Medium  . Hyperlipidemia 07/18/2014    Priority: Medium  . Osteoporosis 06/04/2014    Priority: Medium  . Migraines 06/04/2014    Priority: Medium  . Genital herpes 06/13/2014    Priority: Low  . Glaucoma 06/04/2014    Priority: Low  . History of disease of skin and subcutaneous tissue 07/02/2007    Priority: Low   Past Surgical History:  Procedure Laterality Date  . BREAST CYST ASPIRATION     benign in 2011  . GANGLION CYST EXCISION     left hand; left handed    Family History  Problem Relation Age of Onset  . Osteoporosis Mother   . Arthritis Mother   . Asthma Brother   . Heart disease Paternal Grandfather     26s  . Stroke Paternal Grandfather   . Diabetes Neg Hx   . Hyperlipidemia Neg Hx   . Hypertension Neg Hx   . Cancer Neg Hx   . Kidney disease Father     amylodosis, died in 28s. Tear of aorta and was on blood thinners.     Medications- reviewed and updated Current Outpatient Prescriptions  Medication Sig Dispense Refill  . Calcium Carbonate-Vitamin D (CALCIUM + D PO)      . Multiple Vitamins-Minerals (MULTIVITAMIN,TX-MINERALS) tablet Take 1 tablet by mouth daily.      . SUMAtriptan (IMITREX) 100 MG tablet TAKE 1 TABLET AS NEEDED FOR MIGRAINE HEADACHE AS DIRECTED. 8 tablet 11  . Travoprost, BAK Free, (TRAVATAN) 0.004 % SOLN ophthalmic solution Place 1 drop into both eyes at bedtime.     No current facility-administered medications for this visit.       Allergies-reviewed and updated Allergies  Allergen Reactions  . Depo-Medrol [Methylprednisolone] Hives  . Prednisone Hives  . Sulfur   . Cephalosporins Rash  . Penicillins Rash    Social History   Social History  . Marital status: Married    Spouse name: N/A  . Number of children: 1  . Years of education: 22   Occupational History  . 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 Smoking status: Never Smoker  . Smokeless tobacco: None  . Alcohol use 3.5 oz/week    7 drink(s) per week  . Drug use: No  . Sexual activity: Yes    Partners: Male   Other Topics Concern  . None   Social History Narrative   Married 1980. 1 dtr - '96 - freshman ('15) has some mental health issues but living at home and going to [employer] [facility].       [place]. Business. Engineer, maintenance (IT) for industry.       Hobbies: hike, walking, former runner, crafts, Firefighter, house in [place] with mother. Baking.     Objective: BP 100/72 (BP Location: Left Arm, Patient Position: Sitting, Cuff Size: Normal)   Pulse 72   Temp 97.6 F (36.4 C) (Oral)   Ht 5' 4.75" (1.645 m)  Wt 108 lb 6.4 oz (49.2 kg)   SpO2 97%   BMI 18.18 kg/m j Gen: NAD, resting comfortably HEENT: Mucous membranes are moist. Oropharynx normal Neck: no thyromegaly CV: RRR no murmurs rubs or gallops Lungs: CTAB no crackles, wheeze, rhonchi Abdomen: soft/nontender/nondistended/normal bowel sounds. No rebound or guarding. thin  Ext: no edema Skin: warm, dry Neuro: grossly normal, moves all extremities, PERRLA  Assessment/Plan:  60 y.o. female presenting for annual physical.  Health Maintenance counseling: 1. Anticipatory guidance: Patient counseled regarding regular dental exams, eye exams, wearing seatbelts.  2. Risk factor reduction:  Advised patient of need for regular exercise and diet rich and fruits and vegetables to reduce risk of heart attack and stroke. With all her stressors, exercise has been  down. Advise dwould not advise weight loss 3. Immunizations/screenings/ancillary studies Immunization History  Administered Date(s) Administered  . Influenza,inj,Quad PF,36+ Mos 06/13/2015  . Influenza-Unspecified 06/07/2013, 06/30/2016  . Td 09/01/2001  . Tdap 06/02/2012   Health Maintenance Due  Topic Date Due  . ZOSTAVAX - will wait for shingrix 10/29/2015  . MAMMOGRAM - getting copy- had this year 02/13/2016  . INFLUENZA VACCINE - already had 03/31/2016   4. Cervical cancer screening- 06/18/14 normal with 3 year follow up with gyn 5. Breast cancer screening-  breast exam with GYN and mammogram - as above 6. Colon cancer screening - 2008 with 10 year repeat- will set up next year 7. Skin cancer screening- referred to dermatology last year - no new concerns per patient  Status of chronic or acute concerns  Osteoporosis- on fosamax since 2012, dexa 2014 and again  This year- will get records to review. GYN seems to be managing fosamax   Migraines- imitrex prn. May be as infrequent as twice a year or up to 2-3x a month. Has imitrex on hand- some increased frequency with recent stress  HLD- very mild but excellent HDL- 1.6% 10 year risk- remain off statin  Mild depression ([place]) S:April mother in law diagnosed with bone cancer. In June, mom with heart attack at age 73, now doing well. Uncle with parkinsons had to start in home care- she is power of attorney- spending all her vacation days- transitioned to hospice. husband's dad died in 04-09-2023, family tension. In 05/10/23, husband's mother died. Uncle later died- executor of estate. Very busy with work. Feels very tired- not sure if this is "blue tired" or just tired from all that's going. phq9 of 8 including anhedonia and depressed mood but no SI A/P: discussed medication vs. Counseling and at her level of depression advised counseling- handout given. Follow up 6 weeks.   6 weeks  Meds ordered this encounter  Medications  . Travoprost,  BAK Free, (TRAVATAN) 0.004 % SOLN ophthalmic solution    Sig: Place 1 drop into both eyes at bedtime.  . SUMAtriptan (IMITREX) 100 MG tablet    Sig: TAKE 1 TABLET AS NEEDED FOR MIGRAINE HEADACHE AS DIRECTED.    Dispense:  8 tablet    Refill:  11    Return precautions advised.   Garret Reddish, MD

## 2016-08-20 NOTE — Assessment & Plan Note (Signed)
S:April mother in law diagnosed with bone cancer. In June, mom with heart attack at age 60, now doing well. Uncle with parkinsons had to start in home care- she is power of attorney- spending all her vacation days- transitioned to hospice. husband's dad died in 04-19-23, family tension. In 05/20/2023, husband's mother died. Uncle later died- executor of estate. Very busy with work. Feels very tired- not sure if this is "blue tired" or just tired from all that's going. phq9 of 8 including anhedonia and depressed mood but no SI A/P: discussed medication vs. Counseling and at her level of depression advised counseling- handout given. Follow up 6 weeks.

## 2016-08-20 NOTE — Patient Instructions (Addendum)
Sign release of information at the check out desk for last mammogram and bone density from [facility] with counseling with behavioral health for mild depression. Follow up in 6-8 weeks to recheck on fatigue and depression symptoms  Restart your exercise, at same time- want to make sure you do not lose any weight (slight weight gain would be good)

## 2016-10-12 ENCOUNTER — Encounter: Payer: Self-pay | Admitting: Internal Medicine

## 2016-10-15 ENCOUNTER — Ambulatory Visit: Payer: BC Managed Care – PPO | Admitting: Family Medicine

## 2016-12-17 ENCOUNTER — Encounter: Payer: Self-pay | Admitting: Family Medicine

## 2016-12-17 ENCOUNTER — Ambulatory Visit (INDEPENDENT_AMBULATORY_CARE_PROVIDER_SITE_OTHER): Payer: BC Managed Care – PPO | Admitting: Family Medicine

## 2016-12-17 VITALS — BP 132/78 | HR 85 | Temp 98.4°F | Ht 64.75 in | Wt 111.6 lb

## 2016-12-17 DIAGNOSIS — M791 Myalgia: Secondary | ICD-10-CM

## 2016-12-17 DIAGNOSIS — M7918 Myalgia, other site: Secondary | ICD-10-CM

## 2016-12-17 MED ORDER — TRAMADOL HCL 50 MG PO TABS: 50.0000 mg | ORAL_TABLET | Freq: Four times a day (QID) | ORAL | 0 refills | Status: DC | PRN

## 2016-12-17 MED ORDER — DICLOFENAC SODIUM 75 MG PO TBEC: 75.0000 mg | DELAYED_RELEASE_TABLET | Freq: Two times a day (BID) | ORAL | 0 refills | Status: DC

## 2016-12-17 NOTE — Progress Notes (Signed)
Pre visit review using our clinic review tool, if applicable. No additional management support is needed unless otherwise documented below in the visit note. 

## 2016-12-17 NOTE — Patient Instructions (Addendum)
So sorry this happened to you  Voltaren/diclofenac is a prescription grade ibuprofen essentially. I want you to take twice a day for 5 days with meals. Then can use as needed  Tramadol if pain gets really severe again- also could trial at night if having a hard time sleeping. Would not drive for 8 hours after use   If you feel you need the services of sports medicine or orthopedics - happy to refer you. Could just send [place] a [employer]

## 2016-12-17 NOTE — Progress Notes (Signed)
Subjective:  Melinda Suarez is a 61 y.o. year old very pleasant female patient who presents for/with See problem oriented charting ROS- no leg weakness, does have pain with walking. No feveror chills. No rash over hip or groin.    Past Medical History-  Patient Active Problem List   Diagnosis Date Noted  . Mild depression ([place]) 08/20/2016    Priority: Medium  . Hyperlipidemia 07/18/2014    Priority: Medium  . Osteoporosis 06/04/2014    Priority: Medium  . Migraines 06/04/2014    Priority: Medium  . Genital herpes 06/13/2014    Priority: Low  . Glaucoma 06/04/2014    Priority: Low  . History of disease of skin and subcutaneous tissue 07/02/2007    Priority: Low    Medications- reviewed and updated Current Outpatient Prescriptions  Medication Sig Dispense Refill  . Calcium Carbonate-Vitamin D (CALCIUM + D PO)      . ESTRING 2 MG vaginal ring Place 1 packet vaginally every 3 (three) months.    . Multiple Vitamins-Minerals (MULTIVITAMIN,TX-MINERALS) tablet Take 1 tablet by mouth daily.      . SUMAtriptan (IMITREX) 100 MG tablet TAKE 1 TABLET AS NEEDED FOR MIGRAINE HEADACHE AS DIRECTED. 8 tablet 11  . Travoprost, BAK Free, (TRAVATAN) 0.004 % SOLN ophthalmic solution Place 1 drop into both eyes at bedtime.    . diclofenac (VOLTAREN) 75 MG EC tablet Take 1 tablet (75 mg total) by mouth 2 (two) times daily. 30 tablet 0  . traMADol (ULTRAM) 50 MG tablet Take 1 tablet (50 mg total) by mouth every 6 (six) hours as needed for moderate pain or severe pain (only if over 7/10 pain). 10 tablet 0   No current facility-administered medications for this visit.     Objective: BP 132/78 (BP Location: Left Arm, Patient Position: Sitting, Cuff Size: Normal)   Pulse 85   Temp 98.4 F (36.9 C) (Oral)   Ht 5' 4.75" (1.645 m)   Wt 111 lb 9.6 oz (50.6 kg)   SpO2 95%   BMI 18.71 kg/m  Gen: NAD, resting comfortably CV: RRR no murmurs rubs or gallops Lungs: CTAB no crackles, wheeze,  rhonchi Abdomen: soft/nontender/nondistended/normal bowel sounds. No rebound or guarding.  Ext: no edema Skin: warm, dry  Back - Normal skin, Spine with normal alignment and no deformity.  No tenderness to vertebral process palpation.  Paraspinous muscles are not tender and without spasm.   Range of motion is full at neck and lumbar sacral regions. Negative Straight leg raise. In left lower lumbar spine patient is tender withpalpation and with this exam- mild radiation into upper leg.  Neuro- no saddle anesthesia, 5/5 strength lower extremities, 2+ reflexes   Assessment/Plan:  Left buttock pain S: left buttocks/hip pain since Monday. Took some advil and helped some. She was stretching toward right leg with legs spread and had sudden increase in pain yesterday. Pain has been up to 9/10 last night and had very hard time sleeping. Some mild radiation into the legs. Constant ache and movement can cause shooting pain. 5/10 pain today. Some radiation of pain down to about mid thigh  Dr. Jimmye Norman chiropractor saw this AM. Did x-rays - disc space was much lower than usual in lumbar spine and he thought bulging disc. Did treatment today and will have repeat tomorrow and Monday.  He advised walking/staying movement helpful. Sounds like used TENS unit and roller.   A/P: Patient is fortunately improving under chiropractic care. Suspect this is mild nerve root impingement. Prednisone  allergy. She asks about other potential medicine treatments particularly if pain worsens-  From AVS " Voltaren/diclofenac is a prescription grade ibuprofen essentially. I want you to take twice a day for 5 days with meals. Then can use as needed  Tramadol if pain gets really severe again- also could trial at night if having a hard time sleeping. Would not drive for 8 hours after use:"  Also  "If you feel you need the services of sports medicine or orthopedics - happy to refer you. Could just send [place] a mychart message.  "  Meds ordered this encounter  Medications  . ESTRING 2 MG vaginal ring    Sig: Place 1 packet vaginally every 3 (three) months.  . diclofenac (VOLTAREN) 75 MG EC tablet    Sig: Take 1 tablet (75 mg total) by mouth 2 (two) times daily.    Dispense:  30 tablet    Refill:  0  . traMADol (ULTRAM) 50 MG tablet    Sig: Take 1 tablet (50 mg total) by mouth every 6 (six) hours as needed for moderate pain or severe pain (only if over 7/10 pain).    Dispense:  10 tablet    Refill:  0    Return precautions advised.  Garret Reddish, MD

## 2016-12-31 ENCOUNTER — Telehealth: Payer: Self-pay | Admitting: Family Medicine

## 2016-12-31 ENCOUNTER — Other Ambulatory Visit: Payer: Self-pay

## 2016-12-31 DIAGNOSIS — M7918 Myalgia, other site: Secondary | ICD-10-CM

## 2016-12-31 NOTE — Telephone Encounter (Signed)
Pt would like to move forward with a referral for her back and she has gone in the past to [facility].

## 2016-12-31 NOTE — Telephone Encounter (Signed)
Referral for Orthopedics placed as requested

## 2017-01-07 ENCOUNTER — Ambulatory Visit (INDEPENDENT_AMBULATORY_CARE_PROVIDER_SITE_OTHER): Payer: BC Managed Care – PPO | Admitting: Specialist

## 2017-01-07 ENCOUNTER — Ambulatory Visit (INDEPENDENT_AMBULATORY_CARE_PROVIDER_SITE_OTHER): Payer: Self-pay

## 2017-01-07 ENCOUNTER — Encounter (INDEPENDENT_AMBULATORY_CARE_PROVIDER_SITE_OTHER): Payer: Self-pay | Admitting: Specialist

## 2017-01-07 VITALS — BP 101/68 | HR 73 | Ht 64.75 in | Wt 110.0 lb

## 2017-01-07 DIAGNOSIS — M545 Low back pain: Secondary | ICD-10-CM | POA: Diagnosis not present

## 2017-01-07 NOTE — Progress Notes (Signed)
Office Visit Note   Patient: Melinda Suarez           Date of Birth: [birth year]           MRN: [id number] Visit Date: 01/07/2017              Requested by: Marin Olp, MD [address], [place] [address] PCP: Marin Olp, MD   Assessment & Plan: Visit Diagnoses:  1. Low back pain, unspecified back pain laterality, unspecified chronicity, with sciatica presence unspecified     Plan: Avoid frequent bending and stooping  No lifting greater than 10 lbs. May use ice or moist heat for pain. Weight loss is of benefit.    Follow-Up Instructions: No Follow-up on file.   Orders:  Orders Placed This Encounter  Procedures  . XR Lumbar Spine 2-3 Views   No orders of the defined types were placed in this encounter.     Procedures: No procedures performed   Clinical Data: No additional findings.   Subjective: Chief Complaint  Patient presents with  . Lower Back - Pain, Injury    Onset x 3 weeks ago after doing exercises.    61 year old female with a 3 week history of left thigh and buttock and into the left anterolateral thigh. Pain is a dull ach. Began after exercising that she began for some stiffness in the left leg. Walking feels better,  Bending and stooping and lifting worsens the pain. On a journey to [place] to visit with daughter doing study abroad. Not had an episode with this much. Has osteopenia and takes vitamin D and calcium. Pain , no numbness or tingling.  Left leg is not weak but has had night pain, she has some change in sleep pattern,  she is sleeping on her back and it is not comfortable to sleep on the left side.     Review of Systems  Constitutional: Negative.   HENT: Negative.   Eyes: Negative.   Respiratory: Negative.   Cardiovascular: Negative.   Gastrointestinal: Negative.   Endocrine: Negative.   Genitourinary: Negative.   Musculoskeletal: Negative.   Skin: Negative.   Allergic/Immunologic: Negative.     Neurological: Negative.   Hematological: Negative.   Psychiatric/Behavioral: Negative.      Objective: Vital Signs: BP 101/68 (BP Location: Left Arm, Patient Position: Sitting)   Pulse 73   Ht 5' 4.75" (1.645 m)   Wt 110 lb (49.9 kg)   BMI 18.45 kg/m   Physical Exam  Ortho Exam  Specialty Comments:  No specialty comments available.  Imaging: No results found.   PMFS History: Patient Active Problem List   Diagnosis Date Noted  . Mild depression ([place]) 08/20/2016  . Hyperlipidemia 07/18/2014  . Genital herpes 06/13/2014  . Osteoporosis 06/04/2014  . Migraines 06/04/2014  . Glaucoma 06/04/2014  . History of disease of skin and subcutaneous tissue 07/02/2007   Past Medical History:  Diagnosis Date  . Ganglion cyst   . Migraine headache     Family History  Problem Relation Age of Onset  . Osteoporosis Mother   . Arthritis Mother   . Asthma Brother   . Heart disease Paternal Grandfather        22s  . Stroke Paternal Grandfather   . Diabetes Neg Hx   . Hyperlipidemia Neg Hx   . Hypertension Neg Hx   . Cancer Neg Hx   . Kidney disease Father        amylodosis,  died in 87s. Tear of aorta and was on blood thinners.     Past Surgical History:  Procedure Laterality Date  . BREAST CYST ASPIRATION     benign in 2011  . GANGLION CYST EXCISION     left hand; left handed   Social History   Occupational History  . 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 Smoking status: Never Smoker  . Smokeless tobacco: Never Used  . Alcohol use 3.5 oz/week    7 Standard drinks or equivalent per week  . Drug use: No  . Sexual activity: Yes    Partners: Male

## 2017-01-07 NOTE — Patient Instructions (Signed)
Avoid frequent bending and stooping  No lifting greater than 10 lbs. May use ice or moist heat for pain. Weight loss is of benefit.   

## 2017-08-06 LAB — HM PAP SMEAR: HM Pap smear: NEGATIVE

## 2017-08-20 ENCOUNTER — Encounter: Payer: BC Managed Care – PPO | Admitting: Family Medicine

## 2017-09-21 ENCOUNTER — Encounter: Payer: Self-pay | Admitting: Family Medicine

## 2017-09-21 ENCOUNTER — Ambulatory Visit (INDEPENDENT_AMBULATORY_CARE_PROVIDER_SITE_OTHER): Payer: BC Managed Care – PPO | Admitting: Family Medicine

## 2017-09-21 VITALS — BP 104/60 | HR 70 | Temp 97.9°F | Ht 64.75 in | Wt 112.4 lb

## 2017-09-21 DIAGNOSIS — G43C0 Periodic headache syndromes in child or adult, not intractable: Secondary | ICD-10-CM | POA: Diagnosis not present

## 2017-09-21 DIAGNOSIS — Z1211 Encounter for screening for malignant neoplasm of colon: Secondary | ICD-10-CM | POA: Diagnosis not present

## 2017-09-21 DIAGNOSIS — Z0000 Encounter for general adult medical examination without abnormal findings: Secondary | ICD-10-CM

## 2017-09-21 DIAGNOSIS — Z636 Dependent relative needing care at home: Secondary | ICD-10-CM

## 2017-09-21 DIAGNOSIS — Z23 Encounter for immunization: Secondary | ICD-10-CM | POA: Diagnosis not present

## 2017-09-21 DIAGNOSIS — E785 Hyperlipidemia, unspecified: Secondary | ICD-10-CM

## 2017-09-21 MED ORDER — SUMATRIPTAN SUCCINATE 100 MG PO TABS: ORAL_TABLET | ORAL | 11 refills | Status: DC

## 2017-09-21 NOTE — Assessment & Plan Note (Signed)
Migraines- uses prn imitrex about 10 per year. Refilled today

## 2017-09-21 NOTE — Progress Notes (Signed)
Phone: [phone]  Subjective:  Patient presents today for their annual physical. Chief complaint-noted.   See problem oriented charting- ROS- full  review of systems was completed and negative except for: back pain (has seen chiropractor and ortho before- largely stable as long as avoids things she was told to avoid and does her exercises)  The following were reviewed and entered/updated in epic: Past Medical History:  Diagnosis Date  . Ganglion cyst   . Migraine headache    Patient Active Problem List   Diagnosis Date Noted  . Caregiver stress 08/20/2016    Priority: Medium  . Hyperlipidemia 07/18/2014    Priority: Medium  . Osteoporosis 06/04/2014    Priority: Medium  . Migraines 06/04/2014    Priority: Medium  . Genital herpes 06/13/2014    Priority: Low  . Glaucoma 06/04/2014    Priority: Low  . History of disease of skin and subcutaneous tissue 07/02/2007    Priority: Low   Past Surgical History:  Procedure Laterality Date  . BREAST CYST ASPIRATION     benign in 2011  . GANGLION CYST EXCISION     left hand; left handed    Family History  Problem Relation Age of Onset  . Osteoporosis Mother   . Arthritis Mother   . Asthma Brother   . Heart disease Paternal Grandfather        97s  . Stroke Paternal Grandfather   . Diabetes Neg Hx   . Hyperlipidemia Neg Hx   . Hypertension Neg Hx   . Cancer Neg Hx   . Kidney disease Father        amylodosis, died in 46s. Tear of aorta and was on blood thinners.     Medications- reviewed and updated Current Outpatient Medications  Medication Sig Dispense Refill  . Calcium Carbonate-Vitamin D (CALCIUM + D PO)      . conjugated estrogens (PREMARIN) vaginal cream Place 1 Applicatorful vaginally 3 (three) times a week.    . Multiple Vitamins-Minerals (MULTIVITAMIN,TX-MINERALS) tablet Take 1 tablet by mouth daily.      . SUMAtriptan (IMITREX) 100 MG tablet TAKE 1 TABLET AS NEEDED FOR MIGRAINE HEADACHE AS DIRECTED. 8  tablet 11  . Travoprost, BAK Free, (TRAVATAN) 0.004 % SOLN ophthalmic solution Place 1 drop into both eyes at bedtime.     Allergies-reviewed and updated Allergies  Allergen Reactions  . Depo-Medrol [Methylprednisolone] Hives  . Prednisone Hives  . Sulfur   . Cephalosporins Rash  . Penicillins Rash    Social History   Socioeconomic History  . Marital status: Married    Spouse name: None  . Number of children: 1  . Years of education: 56  . Highest education level: None  Social Needs  . Financial resource strain: None  . Food insecurity - worry: None  . Food insecurity - inability: None  . Transportation needs - medical: None  . Transportation needs - non-medical: None  Occupational History  . 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 Smoking status: Never Smoker  . Smokeless tobacco: Never Used  Substance and Sexual Activity  . Alcohol use: Yes    Alcohol/week: 3.5 oz    Types: 7 Standard drinks or equivalent per week  . Drug use: No  . Sexual activity: Yes    Partners: Male  Other Topics Concern  . None  Social History Narrative   Married 1980. 1 dtr - '96 - freshman ('15) has some mental health  issues but living at home and going to [employer].       [place]. Business. Engineer, maintenance (IT) for industry.       Hobbies: hike, walking, former runner, crafts, Firefighter, house in [place] with mother. Baking.     Objective: BP 104/60 (BP Location: Left Arm, Patient Position: Sitting, Cuff Size: Large)   Pulse 70   Temp 97.9 F (36.6 C) (Oral)   Ht 5' 4.75" (1.645 m)   Wt 112 lb 6.4 oz (51 kg)   SpO2 98%   BMI 18.85 kg/m  Gen: NAD, resting comfortably HEENT: Mucous membranes are moist. Oropharynx normal Neck: no thyromegaly CV: RRR no murmurs rubs or gallops Lungs: CTAB no crackles, wheeze, rhonchi Abdomen: soft/nontender/nondistended/normal bowel sounds. No rebound or guarding.  Ext: no edema Skin: warm, dry Neuro: grossly normal,  moves all extremities, PERRLA  Assessment/Plan:  62 y.o. female presenting for annual physical.  Health Maintenance counseling: 1. Anticipatory guidance: Patient counseled regarding regular dental exams -q6 months, eye exams -q6 months due glaucoma, wearing seatbelts.  2. Risk factor reduction:  Advised patient of need for regular exercise and diet rich and fruits and vegetables to reduce risk of heart attack and stroke. Exercise- does stretches and core exercises and stairs at work and walking on weekends. Diet-reasonable diet- healthy weight. She wants to remain steady  - has been here since junior year in college Wt Readings from Last 3 Encounters:  09/21/17 112 lb 6.4 oz (51 kg)  01/07/17 110 lb (49.9 kg)  12/17/16 111 lb 9.6 oz (50.6 kg)  3. Immunizations/screenings/ancillary studies- shingrix hopefully next year Immunization History  Administered Date(s) Administered  . Influenza,inj,Quad PF,6+ Mos 06/13/2015, 09/21/2017  . Influenza-Unspecified 06/07/2013, 06/30/2016  . Td 09/01/2001  . Tdap 06/02/2012  4. Cervical cancer screening- 05/2014 normal. Get records November 2018.  5. Breast cancer screening-  breast exam with GYN and mammogram - November 2018 repeat 6. Colon cancer screening - refer today- 10/02/16 and now due for repeat 7. Skin cancer screening- no dermatologist. advised regular sunscreen use. Denies worrisome, changing, or new skin lesions.  8. Birth control/STD check- monogamous - no concern infidelity. postmenopausal 9. Osteoporosis screening at 58- most recently 07/16/16 with worst t score -2.4 right hip. It stated she had been on treatment- fosamax since around 2012- 2017. Managed by GYN- off fosamax after 5 years  Status of chronic or acute concerns   Caregiver stress Mild depression 08/20/16- advised 6 week follow up- was after stress of mother in law (now passed) with bone cancer, mom with heart attack at 19, uncle with parkinsons and stay at home care (did pass  since them)- she was using all her time caring for them. Also dad had died within a year. She declined counseling or medication at that time- gave handout and she declined - today phq9 of 2- still caring for her aunt and mom with lung cancer (doing better) and patient is doing much better.  - I am changing this from mild depression to caregiver stress- much improved. Did not need counseling or medication  Hyperlipidemia Hyperlipidemia- very mild- will update lipids and calculate 10 year risk. Prior in 2017 under 2%  Migraines Migraines- uses prn imitrex about 10 per year. Refilled today  1 year physical  Lab/Order associations:  Need for prophylactic vaccination and inoculation against influenza - Plan: Flu Vaccine QUAD 36+ mos IM  Screen for colon cancer - Plan: Ambulatory referral to Gastroenterology  Hyperlipidemia, unspecified hyperlipidemia type - Plan: Lipid  panel, CBC, Comprehensive metabolic panel, CANCELED: CBC, CANCELED: Comprehensive metabolic panel, CANCELED: Lipid panel  Meds ordered this encounter  Medications  . SUMAtriptan (IMITREX) 100 MG tablet    Sig: TAKE 1 TABLET AS NEEDED FOR MIGRAINE HEADACHE AS DIRECTED.    Dispense:  8 tablet    Refill:  11    Return precautions advised.  Garret Reddish, MD

## 2017-09-21 NOTE — Assessment & Plan Note (Signed)
Mild depression 08/20/16- advised 6 week follow up- was after stress of mother in law (now passed) with bone cancer, mom with heart attack at 76, uncle with parkinsons and stay at home care (did pass since them)- she was using all her time caring for them. Also dad had died within a year. She declined counseling or medication at that time- gave handout and she declined - today phq9 of 2- still caring for her aunt and mom with lung cancer (doing better) and patient is doing much better.  - I am changing this from mild depression to caregiver stress- much improved. Did not need counseling or medication

## 2017-09-21 NOTE — Patient Instructions (Addendum)
Sign release of information at the check out desk for records from Dr. Pamala Hurry for pap smear and mammogram  We will call you within a week or two about your referral to GI. If you do not hear within 3 weeks, give [place] a call.   Thanks for doing your flu shot!

## 2017-09-21 NOTE — Assessment & Plan Note (Signed)
Hyperlipidemia- very mild- will update lipids and calculate 10 year risk. Prior in 2017 under 2%

## 2017-09-27 ENCOUNTER — Other Ambulatory Visit (INDEPENDENT_AMBULATORY_CARE_PROVIDER_SITE_OTHER): Payer: BC Managed Care – PPO

## 2017-09-27 DIAGNOSIS — E785 Hyperlipidemia, unspecified: Secondary | ICD-10-CM | POA: Diagnosis not present

## 2017-09-27 LAB — CBC
HEMATOCRIT: 41 % (ref 36.0–46.0)
Hemoglobin: 13.8 g/dL (ref 12.0–15.0)
MCHC: 33.6 g/dL (ref 30.0–36.0)
MCV: 95.6 fl (ref 78.0–100.0)
Platelets: 324 10*3/uL (ref 150.0–400.0)
RBC: 4.29 Mil/uL (ref 3.87–5.11)
RDW: 14.2 % (ref 11.5–15.5)
WBC: 4.8 10*3/uL (ref 4.0–10.5)

## 2017-09-27 LAB — LIPID PANEL
CHOLESTEROL: 207 mg/dL — AB (ref 0–200)
HDL: 88.6 mg/dL (ref >39.00)
LDL CALC: 107 mg/dL — AB (ref 0–99)
NonHDL: 118.84
TRIGLYCERIDES: 60 mg/dL (ref 0.0–149.0)
Total CHOL/HDL Ratio: 2
VLDL: 12 mg/dL (ref 0.0–40.0)

## 2017-09-27 LAB — COMPREHENSIVE METABOLIC PANEL
ALT: 25 U/L (ref 0–35)
AST: 34 U/L (ref 0–37)
Albumin: 4.5 g/dL (ref 3.5–5.2)
Alkaline Phosphatase: 65 U/L (ref 39–117)
BILIRUBIN TOTAL: 0.5 mg/dL (ref 0.2–1.2)
BUN: 22 mg/dL (ref 6–23)
CALCIUM: 9.6 mg/dL (ref 8.4–10.5)
CHLORIDE: 99 meq/L (ref 96–112)
CO2: 33 meq/L — AB (ref 19–32)
CREATININE: 0.81 mg/dL (ref 0.40–1.20)
GFR: 76.17 mL/min (ref >60.00)
GLUCOSE: 99 mg/dL (ref 70–99)
Potassium: 4.7 mEq/L (ref 3.5–5.1)
SODIUM: 139 meq/L (ref 135–145)
Total Protein: 7.4 g/dL (ref 6.0–8.3)

## 2017-10-22 ENCOUNTER — Encounter: Payer: Self-pay | Admitting: Family Medicine

## 2018-03-11 LAB — HM MAMMOGRAPHY

## 2018-03-14 ENCOUNTER — Encounter: Payer: Self-pay | Admitting: Family Medicine

## 2018-08-11 LAB — COLOGUARD: Cologuard: NEGATIVE

## 2018-10-03 ENCOUNTER — Encounter: Payer: Self-pay | Admitting: Family Medicine

## 2018-10-03 ENCOUNTER — Ambulatory Visit (INDEPENDENT_AMBULATORY_CARE_PROVIDER_SITE_OTHER): Payer: BC Managed Care – PPO | Admitting: Family Medicine

## 2018-10-03 VITALS — BP 102/68 | HR 65 | Temp 97.5°F | Ht 64.75 in | Wt 112.2 lb

## 2018-10-03 DIAGNOSIS — Z0000 Encounter for general adult medical examination without abnormal findings: Secondary | ICD-10-CM | POA: Diagnosis not present

## 2018-10-03 DIAGNOSIS — E785 Hyperlipidemia, unspecified: Secondary | ICD-10-CM | POA: Diagnosis not present

## 2018-10-03 LAB — LIPID PANEL
CHOL/HDL RATIO: 3
CHOLESTEROL: 216 mg/dL — AB (ref 0–200)
HDL: 83.3 mg/dL (ref >39.00)
LDL Cholesterol: 120 mg/dL — ABNORMAL HIGH (ref 0–99)
NonHDL: 132.97
TRIGLYCERIDES: 63 mg/dL (ref 0.0–149.0)
VLDL: 12.6 mg/dL (ref 0.0–40.0)

## 2018-10-03 LAB — CBC
HCT: 38.4 % (ref 36.0–46.0)
HEMOGLOBIN: 12.8 g/dL (ref 12.0–15.0)
MCHC: 33.3 g/dL (ref 30.0–36.0)
MCV: 95.4 fl (ref 78.0–100.0)
PLATELETS: 357 10*3/uL (ref 150.0–400.0)
RBC: 4.03 Mil/uL (ref 3.87–5.11)
RDW: 13.2 % (ref 11.5–15.5)
WBC: 4.6 10*3/uL (ref 4.0–10.5)

## 2018-10-03 LAB — COMPREHENSIVE METABOLIC PANEL
ALBUMIN: 4.6 g/dL (ref 3.5–5.2)
ALK PHOS: 59 U/L (ref 39–117)
ALT: 20 U/L (ref 0–35)
AST: 27 U/L (ref 0–37)
BILIRUBIN TOTAL: 0.5 mg/dL (ref 0.2–1.2)
BUN: 18 mg/dL (ref 6–23)
CO2: 33 meq/L — AB (ref 19–32)
CREATININE: 0.69 mg/dL (ref 0.40–1.20)
Calcium: 10.2 mg/dL (ref 8.4–10.5)
Chloride: 100 mEq/L (ref 96–112)
GFR: 85.95 mL/min (ref >60.00)
Glucose, Bld: 92 mg/dL (ref 70–99)
Potassium: 4.1 mEq/L (ref 3.5–5.1)
Sodium: 139 mEq/L (ref 135–145)
TOTAL PROTEIN: 7.7 g/dL (ref 6.0–8.3)

## 2018-10-03 MED ORDER — SUMATRIPTAN SUCCINATE 100 MG PO TABS: ORAL_TABLET | ORAL | 11 refills | Status: DC

## 2018-10-03 NOTE — Patient Instructions (Addendum)
Health Maintenance Due  Topic Date Due  . COLONOSCOPY -has a Cologuard kit at home 10/02/2016  . PAP SMEAR-Modifier -will call [facility] to get report 06/18/2017   When things slow down- call back and we can get you set up for shingrix. Would need epeat injection in 2-5 months.   Please stop by lab before you go If you do not have mychart- we will call you about results within 5 business days of [place] receiving them.  If you have mychart- we will send your results within 3 business days of [place] receiving them.  If abnormal or we want to clarify a result, we will call or mychart you to make sure you receive the message.  If you have questions or concerns or don't hear within 5-7 days, please send [place] a message or call us.

## 2018-10-03 NOTE — Progress Notes (Signed)
Phone: [phone]   Subjective:  Patient presents today for their annual physical. Chief complaint-noted.   See problem oriented charting- ROS- full  review of systems was completed and negative except for:  right knee pain with stooping- gets swelling. Dr. Louanne Skye is seeing her in 2 weeks.   The following were reviewed and entered/updated in epic: Past Medical History:  Diagnosis Date  . Ganglion cyst   . Migraine headache    Patient Active Problem List   Diagnosis Date Noted  . Caregiver stress 08/20/2016    Priority: Medium  . Hyperlipidemia 07/18/2014    Priority: Medium  . Osteoporosis 06/04/2014    Priority: Medium  . Migraines 06/04/2014    Priority: Medium  . Genital herpes 06/13/2014    Priority: Low  . Glaucoma 06/04/2014    Priority: Low  . History of disease of skin and subcutaneous tissue 07/02/2007    Priority: Low   Past Surgical History:  Procedure Laterality Date  . BREAST CYST ASPIRATION     benign in 2011  . GANGLION CYST EXCISION     left hand; left handed    Family History  Problem Relation Age of Onset  . Osteoporosis Mother   . Arthritis Mother   . Lung cancer Mother        died at 5. still driving and working at church  . Kidney disease Father        amylodosis, died in 8s. Tear of aorta and was on blood thinners.   . Asthma Brother   . Heart disease Paternal Grandfather        56s  . Stroke Paternal Grandfather   . Diabetes Neg Hx   . Hyperlipidemia Neg Hx   . Hypertension Neg Hx   . Cancer Neg Hx     Medications- reviewed and updated Current Outpatient Medications  Medication Sig Dispense Refill  . Calcium Carbonate-Vitamin D (CALCIUM + D PO)      . ESTRING 2 MG vaginal ring     . Multiple Vitamins-Minerals (MULTIVITAMIN,TX-MINERALS) tablet Take 1 tablet by mouth daily.      . SUMAtriptan (IMITREX) 100 MG tablet TAKE 1 TABLET AS NEEDED FOR MIGRAINE HEADACHE AS DIRECTED. 8 tablet 11  . Travoprost, BAK Free, (TRAVATAN) 0.004  % SOLN ophthalmic solution Place 1 drop into both eyes at bedtime.     No current facility-administered medications for this visit.     Allergies-reviewed and updated Allergies  Allergen Reactions  . Depo-Medrol [Methylprednisolone] Hives  . Prednisone Hives  . Sulfur   . Cephalosporins Rash  . Penicillins Rash    Social History   Social History Narrative   Married 1980. 1 dtr - '96 - freshman ('15) has some mental health issues but living at home and going to [place].       [employer]. [employer]. Engineer, maintenance (IT) for industry.       Hobbies: hike, walking, former runner, crafts, Firefighter, house in [place] with mother. Baking.    Objective  Objective:  BP 102/68 (BP Location: Left Arm, Patient Position: Sitting, Cuff Size: Normal)   Pulse 65   Temp (!) 97.5 F (36.4 C) (Oral)   Ht 5' 4.75" (1.645 m)   Wt 112 lb 3.2 oz (50.9 kg)   SpO2 100%   BMI 18.82 kg/m  Gen: NAD, resting comfortably HEENT: Mucous membranes are moist. Oropharynx normal Neck: no thyromegaly CV: RRR no murmurs rubs or gallops Lungs: CTAB no crackles, wheeze, rhonchi Abdomen: soft/nontender/nondistended/normal bowel sounds.  No rebound or guarding. thin Ext: no edema Skin: warm, dry Neuro: grossly normal, moves all extremities, PERRLA   Assessment and Plan   63 y.o. female presenting for annual physical.  Health Maintenance counseling: 1. Anticipatory guidance: Patient counseled regarding regular dental exams -q6 months, eye exams - every 6 months due to glacoma,  avoiding smoking and second hand smoke , limiting alcohol to 1 beverage per day .   2. Risk factor reduction:  Advised patient of need for regular exercise and diet rich and fruits and vegetables to reduce risk of heart attack and stroke. Exercise- limited recently with stressors- committed to picking this up- has already discussed with Dr. Pamala Hurry. Diet-has kept up balanced/ealthy diet.  Wt Readings from Last 3 Encounters:  10/03/18 112  lb 3.2 oz (50.9 kg)  09/21/17 112 lb 6.4 oz (51 kg)  01/07/17 110 lb (49.9 kg)  3. Immunizations/screenings/ancillary studies- postpone shingrix for now Immunization History  Administered Date(s) Administered  . Influenza,inj,Quad PF,6+ Mos 06/13/2015, 09/21/2017  . Influenza-Unspecified 06/07/2013, 06/30/2016, 08/02/2018  . Td 09/01/2001  . Tdap 06/02/2012  4. Cervical cancer screening- 2018 with Dr. Pamala Hurry- getting records 5. Breast cancer screening-  breast exam with Dr. Pamala Hurry and mammogram 03/11/18 6. Colon cancer screening - has cologuard at the house needs to complete.  7. Skin cancer screening- infrequent visits to dermatology. advised regular sunscreen use. Denies worrisome, changing, or new skin lesions.  8. Birth control/STD check- postmenopausal and monogomous 9. Osteoporosis screening at 66- being followed by Dr. Pamala Hurry- slight worse on last hip last check- she is going to try to pick up exercise. Also on calcium/vitamin D- was told eventually would need medicine.  10. never smoker  Status of chronic or acute concerns   # vitamin D through Dr. Tiburcio Bash- she thinks it was ok.   #Caregiver stress Dealing with estate for mother- lost mom July 2019.  Caring for aunt- who has memory issues Plus additional stress from things being extremely busy at work.  PHQ9 of 2 today - actually quite impressed with this given level of stress Not interested in counseling  #Hyperlipidemia- very mild- will update lipids and calculate 10 year risk. Prior elevations under 5%  # Migraines- uses prn imitrex about 10 per year- unchanged from prior. Refilled today  Future Appointments  Date Time Provider [place]  10/17/2018  1:45 PM Jessy Oto, MD [facility] None   Lab/Order associations:fasting  Preventative health care - Plan: CBC, Comprehensive metabolic panel, Lipid panel  Hyperlipidemia, unspecified hyperlipidemia type - Plan: CBC, Comprehensive metabolic panel, Lipid  panel  Meds ordered this encounter  Medications  . DISCONTD: SUMAtriptan (IMITREX) 100 MG tablet    Sig: TAKE 1 TABLET AS NEEDED FOR MIGRAINE HEADACHE AS DIRECTED.    Dispense:  8 tablet    Refill:  11  . SUMAtriptan (IMITREX) 100 MG tablet    Sig: TAKE 1 TABLET AS NEEDED FOR MIGRAINE HEADACHE AS DIRECTED.    Dispense:  8 tablet    Refill:  11   Return precautions advised.  Garret Reddish, MD

## 2018-10-04 ENCOUNTER — Telehealth: Payer: Self-pay | Admitting: Family Medicine

## 2018-10-04 NOTE — Telephone Encounter (Signed)
Copied from [place] [phone]. Topic: Quick Communication - See Telephone Encounter >> Oct 04, 2018  2:30 PM Sheran Luz wrote: CRM for notification. See Telephone encounter for: 10/04/18.  Patient calling for lab results from 2/3. Not specified if [facility] can release. Please advise.

## 2018-10-04 NOTE — Telephone Encounter (Signed)
See note

## 2018-10-10 NOTE — Telephone Encounter (Signed)
This has already been taking care of. View result note under labs 10/04/2018 for details.

## 2018-10-17 ENCOUNTER — Encounter (INDEPENDENT_AMBULATORY_CARE_PROVIDER_SITE_OTHER): Payer: Self-pay | Admitting: Specialist

## 2018-10-17 ENCOUNTER — Ambulatory Visit (INDEPENDENT_AMBULATORY_CARE_PROVIDER_SITE_OTHER): Payer: BC Managed Care – PPO | Admitting: Specialist

## 2018-10-17 ENCOUNTER — Ambulatory Visit (INDEPENDENT_AMBULATORY_CARE_PROVIDER_SITE_OTHER): Payer: Self-pay

## 2018-10-17 VITALS — BP 99/61 | HR 82 | Ht 64.75 in | Wt 112.0 lb

## 2018-10-17 DIAGNOSIS — M2241 Chondromalacia patellae, right knee: Secondary | ICD-10-CM

## 2018-10-17 DIAGNOSIS — M25561 Pain in right knee: Secondary | ICD-10-CM | POA: Diagnosis not present

## 2018-10-17 LAB — COLOGUARD

## 2018-10-17 MED ORDER — NAPROXEN 375 MG PO TABS: 375.0000 mg | ORAL_TABLET | Freq: Two times a day (BID) | ORAL | 2 refills | Status: DC

## 2018-10-17 MED ORDER — DICLOFENAC SODIUM 1 % TD GEL: 4.0000 g | Freq: Four times a day (QID) | TRANSDERMAL | 2 refills | Status: DC

## 2018-10-17 NOTE — Patient Instructions (Signed)
  Knee is suffering from osteoarthritis, only real proven treatments are Weight loss, NSAIDs like diclofenac gel and naprosyn and exercise. Well padded shoes help. Ice the knee 2-3 times a day 15-20 mins at a time. Terminal quadriceps strengthening.

## 2018-10-17 NOTE — Progress Notes (Signed)
Office Visit Note   Patient: Melinda Suarez           Date of Birth: 08/29/1956           MRN: [id number] Visit Date: 10/17/2018              Requested by: Marin Olp, MD [place], [place] [address] PCP: Marin Olp, MD   Assessment & Plan: Visit Diagnoses:  1. Right knee pain, unspecified chronicity   2. Chondromalacia patellae, right knee     Plan:  Knee is suffering from osteoarthritis, only real proven treatments are Weight loss, NSAIDs like diclofenac gel and naprosyn and exercise. Well padded shoes help. Ice the knee 2-3 times a day 15-20 mins at a time. Terminal quadriceps strengthening.  Follow-Up Instructions: Return in about 6 weeks (around 11/28/2018).   Orders:  Orders Placed This Encounter  Procedures  . XR KNEE 3 VIEW RIGHT   Meds ordered this encounter  Medications  . naproxen (NAPROSYN) 375 MG tablet    Sig: Take 1 tablet (375 mg total) by mouth 2 (two) times daily with a meal.    Dispense:  60 tablet    Refill:  2  . diclofenac sodium (VOLTAREN) 1 % GEL    Sig: Apply 4 g topically 4 (four) times daily.    Dispense:  3 Tube    Refill:  2      Procedures: No procedures performed   Clinical Data: No additional findings.   Subjective: Chief Complaint  Patient presents with  . Right Knee - Pain    63 year old female with abut 10 week history of right knee pain. She walks daily a couple of miles per day. She noted increased right medial knee pain that started in the early December for late November. She has pain over the right anterior medial knee, difficulty fully flexing the right knee.    Review of Systems  Constitutional: Negative.   HENT: Negative.   Eyes: Negative.   Respiratory: Negative.   Cardiovascular: Negative.   Gastrointestinal: Negative.   Endocrine: Negative.   Genitourinary: Negative.   Musculoskeletal: Negative.   Skin: Negative.   Allergic/Immunologic: Negative.   Neurological:  Negative.   Hematological: Negative.   Psychiatric/Behavioral: Negative.      Objective: Vital Signs: BP 99/61 (BP Location: Left Arm, Patient Position: Sitting)   Pulse 82   Ht 5' 4.75" (1.645 m)   Wt 112 lb (50.8 kg)   BMI 18.78 kg/m   Physical Exam Constitutional:      Appearance: She is well-developed.  HENT:     Head: Normocephalic and atraumatic.  Eyes:     Pupils: Pupils are equal, round, and reactive to light.  Neck:     Musculoskeletal: Normal range of motion and neck supple.  Pulmonary:     Effort: Pulmonary effort is normal.     Breath sounds: Normal breath sounds.  Abdominal:     General: Bowel sounds are normal.     Palpations: Abdomen is soft.  Musculoskeletal: Normal range of motion.  Skin:    General: Skin is warm and dry.  Neurological:     Mental Status: She is alert and oriented to person, place, and time.  Psychiatric:        Behavior: Behavior normal.        Thought Content: Thought content normal.        Judgment: Judgment normal.     Right Knee Exam  Tenderness  The patient is experiencing tenderness in the medial retinaculum, patella and medial joint line.  Range of Motion  Extension: 5  Flexion: 130   Tests  McMurray:  Medial - negative Lateral - negative Varus: negative Valgus: negative Lachman:  Anterior - positive    Posterior - positive Drawer:  Posterior - positive Pivot shift: negative Patellar apprehension: trace  Other  Erythema: absent Scars: absent Sensation: normal Pulse: present  Comments:  Slight effusion right knee.    Left Knee Exam   Range of Motion  Extension: -5  Flexion: 130   Tests  McMurray:  Medial - negative Lateral - negative Varus: negative Valgus: negative Lachman:  Anterior - positive    Posterior - positive Drawer:  Anterior - positive      Pivot shift: negative Patellar apprehension: negative  Other  Erythema: absent Scars: absent Sensation: normal      Specialty Comments:    No specialty comments available.  Imaging: No results found.   PMFS History: Patient Active Problem List   Diagnosis Date Noted  . Caregiver stress 08/20/2016  . Hyperlipidemia 07/18/2014  . Genital herpes 06/13/2014  . Osteoporosis 06/04/2014  . Migraines 06/04/2014  . Glaucoma 06/04/2014  . History of disease of skin and subcutaneous tissue 07/02/2007   Past Medical History:  Diagnosis Date  . Ganglion cyst   . Migraine headache     Family History  Problem Relation Age of Onset  . Osteoporosis Mother   . Arthritis Mother   . Lung cancer Mother        died at 71. still driving and working at church  . Kidney disease Father        amylodosis, died in 61s. Tear of aorta and was on blood thinners.   . Asthma Brother   . Heart disease Paternal Grandfather        65s  . Stroke Paternal Grandfather   . Diabetes Neg Hx   . Hyperlipidemia Neg Hx   . Hypertension Neg Hx   . Cancer Neg Hx     Past Surgical History:  Procedure Laterality Date  . BREAST CYST ASPIRATION     benign in 2011  . GANGLION CYST EXCISION     left hand; left handed   Social History   Occupational History  . 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 Smoking status: Never Smoker  . Smokeless tobacco: Never Used  Substance and Sexual Activity  . Alcohol use: Yes    Alcohol/week: 7.0 standard drinks    Types: 7 Standard drinks or equivalent per week  . Drug use: No  . Sexual activity: Yes    Partners: Male

## 2018-11-16 ENCOUNTER — Encounter: Payer: Self-pay | Admitting: Family Medicine

## 2018-11-23 ENCOUNTER — Telehealth: Payer: Self-pay

## 2018-11-23 NOTE — Telephone Encounter (Signed)
Spoke to pt and notified her that her Cologuard test was negative. Pt verbalized understanding.

## 2019-03-06 ENCOUNTER — Other Ambulatory Visit: Payer: Self-pay

## 2019-03-06 ENCOUNTER — Ambulatory Visit (INDEPENDENT_AMBULATORY_CARE_PROVIDER_SITE_OTHER): Payer: BC Managed Care – PPO | Admitting: Internal Medicine

## 2019-03-06 ENCOUNTER — Encounter: Payer: Self-pay | Admitting: Internal Medicine

## 2019-03-06 DIAGNOSIS — H409 Unspecified glaucoma: Secondary | ICD-10-CM | POA: Diagnosis not present

## 2019-03-06 DIAGNOSIS — H5789 Other specified disorders of eye and adnexa: Secondary | ICD-10-CM

## 2019-03-06 MED ORDER — ERYTHROMYCIN 5 MG/GM OP OINT: 1.0000 "application " | TOPICAL_OINTMENT | Freq: Two times a day (BID) | OPHTHALMIC | 0 refills | Status: DC

## 2019-03-06 NOTE — Progress Notes (Signed)
Virtual Visit via Video Note  I connected with@ on 03/06/19 at  3:45 PM EDT by a video enabled telemedicine application and verified that I am speaking with the correct person using two identifiers. Location patient: home Location provider:work  office Persons participating in the virtual visit: patient, provider  [place]th national recommendations  regarding COVID 19 pandemic   video visit is advised over in office visit for this patient.  Patient aware  of the limitations of evaluation and management by telemedicine and  availability of in person appointments. and agreed to proceed.   HPI: Melinda Suarez presents for video visit  C/o  I think I have "pink eye"  Has hx of glaucoma  And is on  Drops travatan  Per opthalm    Had eye appt fu next weekNo contacts    Noted  Some  Redness in inferior left eye 2 days ago and some crust.  Then  This am crusted  But no vision changes   But irritated and   No fever, uri, chills sinus pain and no eye pain NO  exposures  Taking isolation precautions No rx  ROS: See pertinent positives and negatives per HPI.  Past Medical History:  Diagnosis Date  . Ganglion cyst   . Migraine headache     Past Surgical History:  Procedure Laterality Date  . BREAST CYST ASPIRATION     benign in 2011  . GANGLION CYST EXCISION     left hand; left handed    Family History  Problem Relation Age of Onset  . Osteoporosis Mother   . Arthritis Mother   . Lung cancer Mother        died at 75. still driving and working at church  . Kidney disease Father        amylodosis, died in 40s. Tear of aorta and was on blood thinners.   . Asthma Brother   . Heart disease Paternal Grandfather        11s  . Stroke Paternal Grandfather   . Diabetes Neg Hx   . Hyperlipidemia Neg Hx   . Hypertension Neg Hx   . Cancer Neg Hx     Social History   Tobacco Use  . Smoking status: Never Smoker  . Smokeless tobacco: Never Used  Substance Use Topics  . Alcohol use: Yes    Alcohol/week: 7.0 standard drinks    Types: 7 Standard drinks or equivalent per week  . Drug use: No      Current Outpatient Medications:  .  Calcium Carbonate-Vitamin D (CALCIUM + D PO),  , Disp: , Rfl:  .  diclofenac sodium (VOLTAREN) 1 % GEL, Apply 4 g topically 4 (four) times daily., Disp: 3 Tube, Rfl: 2 .  erythromycin ophthalmic ointment, Place 1 application into the left eye 2 (two) times a day. Lower lid, Disp: 3.5 g, Rfl: 0 .  ESTRING 2 MG vaginal ring, , Disp: , Rfl:  .  Multiple Vitamins-Minerals (MULTIVITAMIN,TX-MINERALS) tablet, Take 1 tablet by mouth daily.  , Disp: , Rfl:  .  naproxen (NAPROSYN) 375 MG tablet, Take 1 tablet (375 mg total) by mouth 2 (two) times daily with a meal., Disp: 60 tablet, Rfl: 2 .  SUMAtriptan (IMITREX) 100 MG tablet, TAKE 1 TABLET AS NEEDED FOR MIGRAINE HEADACHE AS DIRECTED., Disp: 8 tablet, Rfl: 11 .  Travoprost, BAK Free, (TRAVATAN) 0.004 % SOLN ophthalmic solution, Place 1 drop into both eyes at bedtime., Disp: , Rfl:   EXAM: BP Readings from  Last 3 Encounters:  10/17/18 99/61  10/03/18 102/68  09/21/17 104/60    VITALS per patient if applicable: NA generally looks well  GENERAL: alert, oriented, appears well and in no acute distress HEENT: atraumatic,  Has glasses  conjunttiva clear, bulbar  Mild erythem inferior  Left lid and a bid of flaking on lid  No swelling  no obvious abnormalities on inspection of external nose and ears NECK: normal movements of the head and neck  LUNGS: on inspection no signs of respiratory distress, breathing rate appears normal, no obvious gross SOB, gasping or wheezing CV: no obvious cyanosis  PSYCH/NEURO: pleasant and cooperative, no obvious depression or anxiety, speech and thought processing grossly intact   ASSESSMENT AND PLAN:  Discussed the following assessment and plan:    ICD-10-CM   1. Eye discharge poss blepharitis   H57.89    not obv viral conj disc diff dx  clean compresses and if wishes  add ery oint ment  and fu eue prov next week . NO alarm sx   2. Glaucoma, unspecified glaucoma type, unspecified laterality  H40.9    under rx   Eye sx  Under  opthalm care for glaucoma    Counseled.   Expectant management and discussion of plan and treatment with opportunity to ask questions and all were answered. The patient agreed with the plan and demonstrated an understanding of the instructions.   Advised to call back or seek an in-person evaluation if worsening  or having  further concerns .   Shanon Ace, MD

## 2019-10-04 ENCOUNTER — Other Ambulatory Visit: Payer: Self-pay

## 2019-10-05 ENCOUNTER — Encounter: Payer: Self-pay | Admitting: Family Medicine

## 2019-10-05 ENCOUNTER — Ambulatory Visit (INDEPENDENT_AMBULATORY_CARE_PROVIDER_SITE_OTHER): Payer: BC Managed Care – PPO | Admitting: Family Medicine

## 2019-10-05 VITALS — BP 94/64 | HR 64 | Temp 96.6°F | Ht 64.75 in | Wt 108.0 lb

## 2019-10-05 DIAGNOSIS — M810 Age-related osteoporosis without current pathological fracture: Secondary | ICD-10-CM | POA: Diagnosis not present

## 2019-10-05 DIAGNOSIS — G43C0 Periodic headache syndromes in child or adult, not intractable: Secondary | ICD-10-CM | POA: Diagnosis not present

## 2019-10-05 DIAGNOSIS — Z0000 Encounter for general adult medical examination without abnormal findings: Secondary | ICD-10-CM | POA: Diagnosis not present

## 2019-10-05 DIAGNOSIS — E785 Hyperlipidemia, unspecified: Secondary | ICD-10-CM

## 2019-10-05 DIAGNOSIS — H409 Unspecified glaucoma: Secondary | ICD-10-CM

## 2019-10-05 LAB — CBC WITH DIFFERENTIAL/PLATELET
Basophils Absolute: 0.1 10*3/uL (ref 0.0–0.1)
Basophils Relative: 1.3 % (ref 0.0–3.0)
Eosinophils Absolute: 0.3 10*3/uL (ref 0.0–0.7)
Eosinophils Relative: 6.4 % — ABNORMAL HIGH (ref 0.0–5.0)
HCT: 40.6 % (ref 36.0–46.0)
Hemoglobin: 13.7 g/dL (ref 12.0–15.0)
Lymphocytes Relative: 39 % (ref 12.0–46.0)
Lymphs Abs: 1.5 10*3/uL (ref 0.7–4.0)
MCHC: 33.7 g/dL (ref 30.0–36.0)
MCV: 94.6 fl (ref 78.0–100.0)
Monocytes Absolute: 0.4 10*3/uL (ref 0.1–1.0)
Monocytes Relative: 11.4 % (ref 3.0–12.0)
Neutro Abs: 1.6 10*3/uL (ref 1.4–7.7)
Neutrophils Relative %: 41.9 % — ABNORMAL LOW (ref 43.0–77.0)
Platelets: 326 10*3/uL (ref 150.0–400.0)
RBC: 4.29 Mil/uL (ref 3.87–5.11)
RDW: 12.7 % (ref 11.5–15.5)
WBC: 3.9 10*3/uL — ABNORMAL LOW (ref 4.0–10.5)

## 2019-10-05 LAB — COMPREHENSIVE METABOLIC PANEL
ALT: 29 U/L (ref 0–35)
AST: 31 U/L (ref 0–37)
Albumin: 4.4 g/dL (ref 3.5–5.2)
Alkaline Phosphatase: 57 U/L (ref 39–117)
BUN: 21 mg/dL (ref 6–23)
CO2: 31 mEq/L (ref 19–32)
Calcium: 9.8 mg/dL (ref 8.4–10.5)
Chloride: 99 mEq/L (ref 96–112)
Creatinine, Ser: 0.84 mg/dL (ref 0.40–1.20)
GFR: 68.27 mL/min (ref >60.00)
Glucose, Bld: 87 mg/dL (ref 70–99)
Potassium: 4.5 mEq/L (ref 3.5–5.1)
Sodium: 137 mEq/L (ref 135–145)
Total Bilirubin: 0.7 mg/dL (ref 0.2–1.2)
Total Protein: 7.2 g/dL (ref 6.0–8.3)

## 2019-10-05 LAB — LIPID PANEL
Cholesterol: 219 mg/dL — ABNORMAL HIGH (ref 0–200)
HDL: 77.2 mg/dL (ref >39.00)
LDL Cholesterol: 130 mg/dL — ABNORMAL HIGH (ref 0–99)
NonHDL: 141.73
Total CHOL/HDL Ratio: 3
Triglycerides: 58 mg/dL (ref 0.0–149.0)
VLDL: 11.6 mg/dL (ref 0.0–40.0)

## 2019-10-05 LAB — VITAMIN D 25 HYDROXY (VIT D DEFICIENCY, FRACTURES): VITD: 66.15 ng/mL (ref 30.00–100.00)

## 2019-10-05 MED ORDER — SUMATRIPTAN SUCCINATE 100 MG PO TABS: ORAL_TABLET | ORAL | 11 refills | Status: DC

## 2019-10-05 NOTE — Progress Notes (Signed)
Phone: [phone]   Subjective:  Patient presents today for their annual physical. Chief complaint-noted.   See problem oriented charting- ROS- full  review of systems was completed and negative Including No chest pain or shortness of breath. No headache or blurry vision.   The following were reviewed and entered/updated in epic: Past Medical History:  Diagnosis Date  . Ganglion cyst   . Migraine headache    Patient Active Problem List   Diagnosis Date Noted  . Hyperlipidemia 07/18/2014    Priority: Medium  . Osteoporosis 06/04/2014    Priority: Medium  . Migraines 06/04/2014    Priority: Medium  . Caregiver stress 08/20/2016    Priority: Low  . Genital herpes 06/13/2014    Priority: Low  . Glaucoma 06/04/2014    Priority: Low  . History of disease of skin and subcutaneous tissue 07/02/2007    Priority: Low   Past Surgical History:  Procedure Laterality Date  . BREAST CYST ASPIRATION     benign in 2011  . GANGLION CYST EXCISION     left hand; left handed    Family History  Problem Relation Age of Onset  . Osteoporosis Mother   . Arthritis Mother   . Lung cancer Mother        died at 34. still driving and working at church  . Kidney disease Father        amylodosis, died in 56s. Tear of aorta and was on blood thinners.   . Asthma Brother   . Heart disease Paternal Grandfather        72s  . Stroke Paternal Grandfather   . Diabetes Neg Hx   . Hyperlipidemia Neg Hx   . Hypertension Neg Hx   . Cancer Neg Hx     Medications- reviewed and updated Current Outpatient Medications  Medication Sig Dispense Refill  . Calcium Carbonate-Vitamin D (CALCIUM + D PO)      . ESTRING 2 MG vaginal ring     . Multiple Vitamins-Minerals (MULTIVITAMIN,TX-MINERALS) tablet Take 1 tablet by mouth daily.      . SUMAtriptan (IMITREX) 100 MG tablet TAKE 1 TABLET AS NEEDED FOR MIGRAINE HEADACHE AS DIRECTED (one per day and can repeat in 2 hours if persistent- max 2 total doses per  day) 8 tablet 11  . Travoprost, BAK Free, (TRAVATAN) 0.004 % SOLN ophthalmic solution Place 1 drop into both eyes at bedtime.    . diclofenac sodium (VOLTAREN) 1 % GEL Apply 4 g topically 4 (four) times daily. 3 Tube 2  . erythromycin ophthalmic ointment Place 1 application into the left eye 2 (two) times a day. Lower lid 3.5 g 0  . naproxen (NAPROSYN) 375 MG tablet Take 1 tablet (375 mg total) by mouth 2 (two) times daily with a meal. 60 tablet 2   No current facility-administered medications for this visit.    Allergies-reviewed and updated Allergies  Allergen Reactions  . Depo-Medrol [Methylprednisolone] Hives  . Prednisone Hives  . Sulfur   . Cephalosporins Rash  . Penicillins Rash    Social History   Social History Narrative   Married 1980. 1 dtr - '96 - freshman ('15) has some mental health issues but living at home and going to [employer].       [place]. Business. Engineer, maintenance (IT) for industry.       Hobbies: hike, walking, former runner, crafts, Firefighter, house in [place] with mother. Baking.    Objective  Objective:  BP 94/64   Pulse  64   Temp (!) 96.6 F (35.9 C)   Ht 5' 4.75" (1.645 m)   Wt 108 lb (49 kg)   SpO2 97%   BMI 18.11 kg/m  Gen: NAD, resting comfortably HEENT: Mask not removed due to covid 19. TM normal. Bridge of nose normal. Eyelids normal.  Neck: no thyromegaly or cervical lymphadenopathy  CV: RRR no murmurs rubs or gallops Lungs: CTAB no crackles, wheeze, rhonchi Abdomen: soft/nontender/nondistended/normal bowel sounds. No rebound or guarding.  Ext: no edema Skin: warm, dry Neuro: grossly normal, moves all extremities, PERRLA   Assessment and Plan   64 y.o. female presenting for annual physical.  Health Maintenance counseling: 1. Anticipatory guidance: Patient counseled regarding regular dental exams -q6 months, eye exams -every 6 months due to glaucoma,  avoiding smoking and second hand smoke , limiting alcohol to 1 beverage per day- does  a dry month each january .   2. Risk factor reduction:  Advised patient of need for regular exercise and diet rich and fruits and vegetables to reduce risk of heart attack and stroke. Exercise- discussed ideally 150 minutes a week-she is planning to increase this month- cold weather made it hard . Getting good # of stairs at home. Diet-continue healthy eating/encouraged slight weight gain over next year.  Wt Readings from Last 3 Encounters:  10/05/19 108 lb (49 kg)  10/17/18 112 lb (50.8 kg)  10/03/18 112 lb 3.2 oz (50.9 kg)  3. Immunizations/screenings/ancillary studies-influenza vaccine already given.  Wants to postpone Shingrix for now defers until next year. Discussed COVID-19 vaccination when available for her age group and interested  Immunization History  Administered Date(s) Administered  . Influenza,inj,Quad PF,6+ Mos 06/13/2015, 09/21/2017, 06/20/2019  . Influenza-Unspecified 06/07/2013, 06/30/2016, 08/02/2018  . Td 09/01/2001  . Tdap 06/02/2012  4. Cervical cancer screening-with Dr. Valentino Saxon of [facility]-last on file 08/06/2017 but HPV negative- repeat 2023  5. Breast cancer screening-  breast exam with Dr. Valentino Saxon and mammogram -with last on file from July 2019 but has had since then/we will get records 6. Colon cancer screening -has opted for Cologuard in the past-normal October 17, 2018 with plan for 3-year repeat  7. Skin cancer screening-as needed visits to dermatology. advised regular sunscreen use. Denies worrisome, changing, or new skin lesions.  8. Birth control/STD check-postmenopausal and monogamous 9. Osteoporosis screening at 65-see below -Never smoker  Status of chronic or acute concerns   Age-related osteoporosis without current pathological fracture-known osteoporosis followed by Dr. Valentino Saxon.  Not currently on bisphosphonate.  Vitamin D check today.  We discussed calcium and vitamin D goals- does caltrate and an extra D.   Migraines-continues use about 10  Imitrex per year.  Unchanged from prior.  Refill today. No clear triggers   Hyperlipidemia-mild hyperlipidemia with last 10-year ASCVD risk only 2.3%.  We discussed generally would not use medicine under 7.5% Lab Results  Component Value Date   CHOL 216 (H) 10/03/2018   HDL 83.30 10/03/2018   LDLCALC 120 (H) 10/03/2018   LDLDIRECT 95.7 12/16/2010   TRIG 63.0 10/03/2018   CHOLHDL 3 10/03/2018    Glaucoma-continue twice yearly follow-up with ophthalmology  Recommended follow up: Return in about 1 year (around 10/04/2020) for physical or sooner if needed.  Lab/Order associations: fasting   ICD-10-CM   1. Preventative health care  Z00.00 CBC with Differential/Platelet    Comprehensive metabolic panel    Lipid panel    VITAMIN D 25 Hydroxy (Vit-D Deficiency, Fractures)  2. Age-related osteoporosis without current pathological  fracture  M81.0 VITAMIN D 25 Hydroxy (Vit-D Deficiency, Fractures)  3. Periodic headache syndrome, not intractable  G43.C0   4. Hyperlipidemia, unspecified hyperlipidemia type  E78.5 CBC with Differential/Platelet    Comprehensive metabolic panel    Lipid panel  5. Glaucoma, unspecified glaucoma type, unspecified laterality  H40.9     Meds ordered this encounter  Medications  . SUMAtriptan (IMITREX) 100 MG tablet    Sig: TAKE 1 TABLET AS NEEDED FOR MIGRAINE HEADACHE AS DIRECTED (one per day and can repeat in 2 hours if persistent- max 2 total doses per day)    Dispense:  8 tablet    Refill:  11    Return precautions advised.  Garret Reddish, MD

## 2019-10-05 NOTE — Patient Instructions (Addendum)
Lets try to gain a few lbs back- at least the 4 you lost over the last year   Please stop by lab before you go If you do not have mychart- we will call you about results within 5 business days of [place] receiving them.  If you have mychart- we will send your results within 3 business days of [place] receiving them.  If abnormal or we want to clarify a result, we will call or mychart you to make sure you receive the message.  If you have questions or concerns or don't hear within 5-7 days, please send [place] a message or call us.    Recommended follow up: Return in about 1 year (around 10/04/2020) for physical or sooner if needed.

## 2019-10-12 ENCOUNTER — Ambulatory Visit: Payer: BC Managed Care – PPO

## 2020-03-11 ENCOUNTER — Other Ambulatory Visit: Payer: Self-pay

## 2020-03-11 ENCOUNTER — Ambulatory Visit (INDEPENDENT_AMBULATORY_CARE_PROVIDER_SITE_OTHER): Payer: BC Managed Care – PPO | Admitting: Family Medicine

## 2020-03-11 ENCOUNTER — Encounter: Payer: Self-pay | Admitting: Family Medicine

## 2020-03-11 VITALS — BP 100/62 | HR 76 | Temp 98.4°F | Ht 64.75 in | Wt 112.4 lb

## 2020-03-11 DIAGNOSIS — R2231 Localized swelling, mass and lump, right upper limb: Secondary | ICD-10-CM | POA: Diagnosis not present

## 2020-03-11 DIAGNOSIS — G514 Facial myokymia: Secondary | ICD-10-CM

## 2020-03-11 DIAGNOSIS — D72819 Decreased white blood cell count, unspecified: Secondary | ICD-10-CM | POA: Diagnosis not present

## 2020-03-11 DIAGNOSIS — G249 Dystonia, unspecified: Secondary | ICD-10-CM | POA: Diagnosis not present

## 2020-03-11 NOTE — Patient Instructions (Addendum)
Health Maintenance Due  Topic Date Due  . MAMMOGRAM has app in next mo 03/11/2020   Finger: ganglion cyst on one of them. We are sending referral to sports medicine to have evaluation for the right hand nodule/possible thickened tendon  We will call you within two weeks about your referral to sports medicine. If you do not hear within 3 weeks, give [place] a call.    Wart: you can use wart stick or salicylic acid. We have given you a handout with treatment instructions and options.   Facial twitching: May be related to stress. Try to take time for yourself. Also reduce caffeine if possible -get head CT to be on safe side to rule out obvious mass. We will call you within two weeks about your referral to CT head. If you do not hear within 3 weeks, give [place] a call.  -also get labs  Please stop by lab before you go If you have mychart- we will send your results within 3 business days of [place] receiving them.  If you do not have mychart- we will call you about results within 5 business days of [place] receiving them.

## 2020-03-11 NOTE — Progress Notes (Signed)
Phone [phone] In person visit   Subjective:   Melinda Suarez is a 64 y.o. year old very pleasant female patient who presents for/with See problem oriented charting Chief Complaint  Patient presents with  . Spasms   This visit occurred during the SARS-CoV-2 public health emergency.  Safety protocols were in place, including screening questions prior to the visit, additional usage of staff PPE, and extensive cleaning of exam room while observing appropriate contact time as indicated for disinfecting solutions.   Past Medical History-  Patient Active Problem List   Diagnosis Date Noted  . Hyperlipidemia 07/18/2014    Priority: Medium  . Osteoporosis 06/04/2014    Priority: Medium  . Migraines 06/04/2014    Priority: Medium  . Caregiver stress 08/20/2016    Priority: Low  . Glaucoma 06/04/2014    Priority: Low  . History of disease of skin and subcutaneous tissue 07/02/2007    Priority: Low    Medications- reviewed and updated Current Outpatient Medications  Medication Sig Dispense Refill  . Calcium Carbonate-Vitamin D (CALCIUM + D PO)      . ESTRING 2 MG vaginal ring     . Multiple Vitamins-Minerals (MULTIVITAMIN,TX-MINERALS) tablet Take 1 tablet by mouth daily.      . SUMAtriptan (IMITREX) 100 MG tablet TAKE 1 TABLET AS NEEDED FOR MIGRAINE HEADACHE AS DIRECTED (one per day and can repeat in 2 hours if persistent- max 2 total doses per day) 8 tablet 11  . Travoprost, BAK Free, (TRAVATAN) 0.004 % SOLN ophthalmic solution Place 1 drop into both eyes at bedtime.     No current facility-administered medications for this visit.     Objective:  BP 100/62   Pulse 76   Temp 98.4 F (36.9 C) (Temporal)   Ht 5' 4.75" (1.645 m)   Wt 112 lb 6.4 oz (51 kg)   SpO2 97%   BMI 18.85 kg/m  Gen: NAD, resting comfortably CV: RRR no murmurs rubs or gallops Lungs: CTAB no crackles, wheeze, rhonchi Ext: no edema MSK: Thickening in a bandlike fashion from MCP joint down into the  hand of right second finger  Neuro: CN II-XII intact-seem to have a very slight droop of smile on left but patient states this is her baseline-does have intermittent twitching below and above the left eye, sensation and reflexes normal throughout, 5/5 muscle strength in bilateral upper and lower extremities. Normal finger to nose. Normal rapid alternating movements. No pronator drift. Normal romberg. Normal gait.      Assessment and Plan    #Left-sided facial twitching S:2-3 months of symptoms. little better recently. Comes and goes left side under eye and left cheek- also tends to go down to the left cheek at times. October, November, December of 2019 after mom died had similar but less severe. Only left side of her face and that was the same side as before.   A lot of stress caring for elderly aunt, taking daughter Nicole Kindred to [place], still executor of mom's estate- states 4 years of stress plus brother leaving his wife.    Has not noticed anything that makes it worse. It has been daily several times a day. Normally last from seconds to several minutes.  A/P: 64 year old female with left-sided facial twitching over the last few months-periorbital but also includes lower face at times.  We agreed to head CT to rule out any large mass.  I strongly suspect this is stress related as has occurred in the past with periods of high  stress.  We also will update magnesium and CMP to make sure no electrolyte abnormalities.  Caffeine can sometimes be a trigger and advised to cut back on this.  The stress of possible-she is hopeful some of her stressors may improve in coming months  #Right finger nodule S:Left handed/ambidexterious. Base of 2nd right finger- just proximal to MCP joint notes thickening for about a month- has had ganglion cyst before and wonders if this is similar   She does have a ganglion cyst on fifth finger IP joint A/P: Abnormality seems like Dupuytren's contracture but that is more  common in the fourth and fifth finger-I am going to refer her to sports medicine for their opinion but this almost seems like Dupuytren's contracture of right second finger  #Leukopenia-noted at last blood work-update CBC with differential today  Recommended follow up: We will keep regular scheduled physical Future Appointments  Date Time Provider [place]  04/18/2020  9:00 AM Lyndal Pulley, DO [facility] None  10/14/2020  8:20 AM Yong Channel Brayton Mars, MD [facility]   Lab/Order associations:   ICD-10-CM   1. Facial twitching  G51.4 Magnesium    Comprehensive metabolic panel  2. Nodule of finger of right hand  R22.31 Ambulatory referral to Sports Medicine  3. Dystonia  G24.9 CT Head Wo Contrast  4. Leukopenia, unspecified type  D72.819 CBC with Differential/Platelet    Time Spent: 31 minutes of total time (2:46 PM-3:37 PM with exception of 20 minutes used on a different visit while awaiting consult with a colleague) was spent on the date of the encounter performing the following actions: chart review prior to seeing the patient, obtaining history, performing a medically necessary exam, counseling on the treatment plan, placing orders, and documenting in our EHR.   Return precautions advised.  Garret Reddish, MD

## 2020-03-12 LAB — COMPREHENSIVE METABOLIC PANEL
ALT: 23 U/L (ref 0–35)
AST: 27 U/L (ref 0–37)
Albumin: 4.6 g/dL (ref 3.5–5.2)
Alkaline Phosphatase: 67 U/L (ref 39–117)
BUN: 30 mg/dL — ABNORMAL HIGH (ref 6–23)
CO2: 33 mEq/L — ABNORMAL HIGH (ref 19–32)
Calcium: 9.8 mg/dL (ref 8.4–10.5)
Chloride: 98 mEq/L (ref 96–112)
Creatinine, Ser: 0.71 mg/dL (ref 0.40–1.20)
GFR: 82.78 mL/min (ref >60.00)
Glucose, Bld: 95 mg/dL (ref 70–99)
Potassium: 4.3 mEq/L (ref 3.5–5.1)
Sodium: 139 mEq/L (ref 135–145)
Total Bilirubin: 0.5 mg/dL (ref 0.2–1.2)
Total Protein: 7.2 g/dL (ref 6.0–8.3)

## 2020-03-12 LAB — CBC WITH DIFFERENTIAL/PLATELET
Basophils Absolute: 0.1 10*3/uL (ref 0.0–0.1)
Basophils Relative: 0.9 % (ref 0.0–3.0)
Eosinophils Absolute: 0.2 10*3/uL (ref 0.0–0.7)
Eosinophils Relative: 4.3 % (ref 0.0–5.0)
HCT: 36.7 % (ref 36.0–46.0)
Hemoglobin: 12.4 g/dL (ref 12.0–15.0)
Lymphocytes Relative: 28.3 % (ref 12.0–46.0)
Lymphs Abs: 1.6 10*3/uL (ref 0.7–4.0)
MCHC: 33.7 g/dL (ref 30.0–36.0)
MCV: 96.1 fl (ref 78.0–100.0)
Monocytes Absolute: 0.6 10*3/uL (ref 0.1–1.0)
Monocytes Relative: 10.6 % (ref 3.0–12.0)
Neutro Abs: 3.2 10*3/uL (ref 1.4–7.7)
Neutrophils Relative %: 55.9 % (ref 43.0–77.0)
Platelets: 313 10*3/uL (ref 150.0–400.0)
RBC: 3.82 Mil/uL — ABNORMAL LOW (ref 3.87–5.11)
RDW: 14.2 % (ref 11.5–15.5)
WBC: 5.7 10*3/uL (ref 4.0–10.5)

## 2020-03-12 LAB — MAGNESIUM: Magnesium: 2.2 mg/dL (ref 1.5–2.5)

## 2020-04-02 LAB — HM MAMMOGRAPHY

## 2020-04-04 ENCOUNTER — Encounter: Payer: Self-pay | Admitting: Family Medicine

## 2020-04-18 ENCOUNTER — Other Ambulatory Visit: Payer: Self-pay

## 2020-04-18 ENCOUNTER — Ambulatory Visit: Payer: BC Managed Care – PPO | Admitting: Family Medicine

## 2020-04-18 ENCOUNTER — Ambulatory Visit: Payer: Self-pay

## 2020-04-18 ENCOUNTER — Encounter: Payer: Self-pay | Admitting: Family Medicine

## 2020-04-18 VITALS — BP 110/72 | HR 73 | Ht 64.75 in | Wt 113.0 lb

## 2020-04-18 DIAGNOSIS — M71341 Other bursal cyst, right hand: Secondary | ICD-10-CM | POA: Diagnosis not present

## 2020-04-18 DIAGNOSIS — M79641 Pain in right hand: Secondary | ICD-10-CM | POA: Diagnosis not present

## 2020-04-18 NOTE — Progress Notes (Signed)
[place] Phone: [phone] Subjective:   Fontaine No, am serving as a scribe for Dr. Hulan Saas. This visit occurred during the SARS-CoV-2 public health emergency.  Safety protocols were in place, including screening questions prior to the visit, additional usage of staff PPE, and extensive cleaning of exam room while observing appropriate contact time as indicated for disinfecting solutions.   I'm seeing this patient by the request  of:  Marin Olp, MD  CC: Hand exam mild mass  [id number]  Tammey Deeg is a 64 y.o. female coming in with complaint of right hand, 2nd finger tendon thickening for past few months. No pain or catching. Also having thickening 4th MCP joint. 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  Patient denies that this is changed significantly over the years.  Has been married.  Never seem to be painful.  Just would like to know what it possibly would be.    Past Medical History:  Diagnosis Date  . Ganglion cyst   . Migraine headache    Past Surgical History:  Procedure Laterality Date  . BREAST CYST ASPIRATION     benign in 2011  . GANGLION CYST EXCISION     left hand; left handed   Social History   Socioeconomic History  . Marital status: Married    Spouse name: Not on file  . Number of children: 1  . Years of education: 72  . Highest education level: Not on file  Occupational History  . 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 Smoking status: Never Smoker  . Smokeless tobacco: Never Used  Substance and Sexual Activity  . Alcohol use: Yes    Alcohol/week: 7.0 standard drinks    Types: 7 Standard drinks or equivalent per week  . Drug use: No  . Sexual activity: Yes    Partners: Male  Other Topics Concern  . Not on file  Social History Narrative   Married 1980. 1 dtr - '96 -  freshman ('15) has some mental health issues but living at home and going to [employer].       [place]. Business. Engineer, maintenance (IT) for industry.       Hobbies: hike, walking, former runner, crafts, Firefighter, house in [place] with mother. Baking.    Social Determinants of Health   Financial Resource Strain:   . Difficulty of Paying Living Expenses: Not on file  Food Insecurity:   . Worried About Charity fundraiser in the Last Year: Not on file  . Ran Out of Food in the Last Year: Not on file  Transportation Needs:   . Lack of Transportation (Medical): Not on file  . Lack of Transportation (Non-Medical): Not on file  Physical Activity:   . Days of Exercise per Week: Not on file  . Minutes of Exercise per Session: Not on file  Stress:   . Feeling of Stress : Not on file  Social Connections:   . Frequency of Communication with Friends and Family: Not on file  . Frequency of Social Gatherings with Friends and Family: Not on file  . Attends Religious Services: Not on file  . Active Member of Clubs or Organizations: Not on file  . Attends Archivist Meetings: Not on file  . Marital Status: Not on file   Allergies  Allergen Reactions  .  Depo-Medrol [Methylprednisolone] Hives  . Prednisone Hives  . Sulfur   . Cephalosporins Rash  . Penicillins Rash   Family History  Problem Relation Age of Onset  . Osteoporosis Mother   . Arthritis Mother   . Lung cancer Mother        died at 80. still driving and working at church  . Kidney disease Father        amylodosis, died in 36s. Tear of aorta and was on blood thinners.   . Asthma Brother   . Heart disease Paternal Grandfather        59s  . Stroke Paternal Grandfather   . Diabetes Neg Hx   . Hyperlipidemia Neg Hx   . Hypertension Neg Hx   . Cancer Neg Hx        Current Outpatient Medications (Analgesics):  Marland Kitchen  SUMAtriptan (IMITREX) 100 MG tablet, TAKE 1 TABLET AS NEEDED FOR MIGRAINE HEADACHE AS DIRECTED (one per day  and can repeat in 2 hours if persistent- max 2 total doses per day)   Current Outpatient Medications (Other):  Marland Kitchen  Calcium Carbonate-Vitamin D (CALCIUM + D PO),   .  ESTRING 2 MG vaginal ring,  .  Multiple Vitamins-Minerals (MULTIVITAMIN,TX-MINERALS) tablet, Take 1 tablet by mouth daily.   .  Travoprost, BAK Free, (TRAVATAN) 0.004 % SOLN ophthalmic solution, Place 1 drop into both eyes at bedtime.   Reviewed prior external information including notes and imaging from  primary care provider As well as notes that were available from care everywhere and other healthcare systems.  Past medical history, social, surgical and family history all reviewed in electronic medical record.  No pertanent information unless stated regarding to the chief complaint.   Review of Systems:  No headache, visual changes, nausea, vomiting, diarrhea, constipation, dizziness, abdominal pain, skin rash, fevers, chills, night sweats, weight loss, swollen lymph nodes, body aches, joint swelling, chest pain, shortness of breath, mood changes. POSITIVE muscle aches  Objective  Blood pressure 110/72, pulse 73, height 5' 4.75" (1.645 m), weight 113 lb (51.3 kg), SpO2 98 %.   General: No apparent distress alert and oriented x3 mood and affect normal, dressed appropriately.  HEENT: Pupils equal, extraocular movements intact  Respiratory: Patient's speak in full sentences and does not appear short of breath  Cardiovascular: No lower extremity edema, non tender, no erythema  Neuro: Cranial nerves II through XII are intact, neurovascularly intact in all extremities with 2+ DTRs and 2+ pulses.  Gait antalgic MSK: Patient's hand exam on the palmar aspect over the index finger has a very small cystic formation noted.  Nontender freely movable.  Nonpulsatile.  Limited musculoskeletal ultrasound was performed and interpreted by Lyndal Pulley  Limited ultrasound of patient's hand shows that this is potentially cystic formation  and seems to be within the tendon sheath but needs superficial wound in the subcutaneous and then underneath the tendon itself.  No abnormal vascularity in the area.  Difficult to assess if Ganglion cyst or not.   Impression and Recommendations:     The above documentation has been reviewed and is accurate and complete Lyndal Pulley, DO       Note: This dictation was prepared with Dragon dictation along with smaller phrase technology. Any transcriptional errors that result from this process are unintentional.

## 2020-04-18 NOTE — Patient Instructions (Signed)
Gel padded weight lifting gloves with work and working out Avoid direct impact if possible See me again in 3 months to re-measure

## 2020-04-18 NOTE — Assessment & Plan Note (Signed)
Pulmonary cyst noted.  Not appear to be completely ganglion, seems to be more of a trigger nodule but not coming from impingement.  Discussed with patient about avoiding direct impact in the area, I do not have any abnormal vascularity noted on ultrasound today but I do feel that follow-up with a 5-month window to check the size of it again.  Today measures 0.42 cm in diameter.  Patient knows to call us if any other things seem to make it significantly worsen.

## 2020-04-22 ENCOUNTER — Other Ambulatory Visit: Payer: Self-pay

## 2020-04-22 ENCOUNTER — Ambulatory Visit (INDEPENDENT_AMBULATORY_CARE_PROVIDER_SITE_OTHER)
Admission: RE | Admit: 2020-04-22 | Discharge: 2020-04-22 | Disposition: A | Discharge status: Home / Self Care | Payer: BC Managed Care – PPO | Source: Ambulatory Visit | Attending: Family Medicine | Admitting: Family Medicine

## 2020-04-22 DIAGNOSIS — G249 Dystonia, unspecified: Secondary | ICD-10-CM

## 2020-07-18 ENCOUNTER — Other Ambulatory Visit: Payer: Self-pay

## 2020-07-18 ENCOUNTER — Encounter: Payer: Self-pay | Admitting: Family Medicine

## 2020-07-18 ENCOUNTER — Ambulatory Visit: Payer: BC Managed Care – PPO | Admitting: Family Medicine

## 2020-07-18 ENCOUNTER — Ambulatory Visit: Payer: Self-pay

## 2020-07-18 VITALS — BP 108/72 | HR 72 | Ht 64.75 in | Wt 126.0 lb

## 2020-07-18 DIAGNOSIS — M2550 Pain in unspecified joint: Secondary | ICD-10-CM

## 2020-07-18 DIAGNOSIS — M71341 Other bursal cyst, right hand: Secondary | ICD-10-CM

## 2020-07-18 DIAGNOSIS — M79641 Pain in right hand: Secondary | ICD-10-CM | POA: Diagnosis not present

## 2020-07-18 LAB — COMPREHENSIVE METABOLIC PANEL
ALT: 25 U/L (ref 0–35)
AST: 33 U/L (ref 0–37)
Albumin: 4.5 g/dL (ref 3.5–5.2)
Alkaline Phosphatase: 58 U/L (ref 39–117)
BUN: 25 mg/dL — ABNORMAL HIGH (ref 6–23)
CO2: 33 mEq/L — ABNORMAL HIGH (ref 19–32)
Calcium: 9.9 mg/dL (ref 8.4–10.5)
Chloride: 98 mEq/L (ref 96–112)
Creatinine, Ser: 0.77 mg/dL (ref 0.40–1.20)
GFR: 81.35 mL/min (ref >60.00)
Glucose, Bld: 81 mg/dL (ref 70–99)
Potassium: 4.3 mEq/L (ref 3.5–5.1)
Sodium: 138 mEq/L (ref 135–145)
Total Bilirubin: 0.7 mg/dL (ref 0.2–1.2)
Total Protein: 7.6 g/dL (ref 6.0–8.3)

## 2020-07-18 LAB — CBC WITH DIFFERENTIAL/PLATELET
Basophils Absolute: 0.1 10*3/uL (ref 0.0–0.1)
Basophils Relative: 1 % (ref 0.0–3.0)
Eosinophils Absolute: 0.2 10*3/uL (ref 0.0–0.7)
Eosinophils Relative: 4.1 % (ref 0.0–5.0)
HCT: 38.9 % (ref 36.0–46.0)
Hemoglobin: 13.1 g/dL (ref 12.0–15.0)
Lymphocytes Relative: 32 % (ref 12.0–46.0)
Lymphs Abs: 1.7 10*3/uL (ref 0.7–4.0)
MCHC: 33.7 g/dL (ref 30.0–36.0)
MCV: 94.9 fl (ref 78.0–100.0)
Monocytes Absolute: 0.5 10*3/uL (ref 0.1–1.0)
Monocytes Relative: 10.3 % (ref 3.0–12.0)
Neutro Abs: 2.7 10*3/uL (ref 1.4–7.7)
Neutrophils Relative %: 52.6 % (ref 43.0–77.0)
Platelets: 355 10*3/uL (ref 150.0–400.0)
RBC: 4.1 Mil/uL (ref 3.87–5.11)
RDW: 13.6 % (ref 11.5–15.5)
WBC: 5.2 10*3/uL (ref 4.0–10.5)

## 2020-07-18 LAB — SEDIMENTATION RATE: Sed Rate: 14 mm/hr (ref 0–30)

## 2020-07-18 NOTE — Progress Notes (Signed)
[place] Phone: [phone] Subjective:   Fontaine No, am serving as a scribe for Dr. Hulan Saas. This visit occurred during the SARS-CoV-2 public health emergency.  Safety protocols were in place, including screening questions prior to the visit, additional usage of staff PPE, and extensive cleaning of exam room while observing appropriate contact time as indicated for disinfecting solutions.   I'm seeing this patient by the request  of:  Marin Olp, MD  CC: Right hand pain  RWE:[id number]   04/18/2020 Pulmonary cyst noted.  Not appear to be completely ganglion, seems to be more of a trigger nodule but not coming from impingement.  Discussed with patient about avoiding direct impact in the area, I do not have any abnormal vascularity noted on ultrasound today but I do feel that follow-up with a 97-month window to check the size of it again.  Today measures 0.42 cm in diameter.  Patient knows to call us if any other things seem to make it significantly worsen.  Update 07/18/2020 Melinda Suarez is a 64 y.o. female coming in with complaint of right hand pain. Patient states that she has been having thickness of tendon to the ring finger. No pain.  Patient continues to have some discomfort and pain. Patient states that he can do all activities.  Does notice repetitive activity might make it a little more difficult.  When asking multiple questions patient does have a strong family history of amyloidosis in her father.     Past Medical History:  Diagnosis Date  . Ganglion cyst   . Migraine headache    Past Surgical History:  Procedure Laterality Date  . BREAST CYST ASPIRATION     benign in 2011  . GANGLION CYST EXCISION     left hand; left handed   Social History   Socioeconomic History  . Marital status: Married    Spouse name: Not on file  . Number of children: 1  . Years of education: 8  .  Highest education level: Not on file  Occupational History  . 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 Smoking status: Never Smoker  . Smokeless tobacco: Never Used  Substance and Sexual Activity  . Alcohol use: Yes    Alcohol/week: 7.0 standard drinks    Types: 7 Standard drinks or equivalent per week  . Drug use: No  . Sexual activity: Yes    Partners: Male  Other Topics Concern  . Not on file  Social History Narrative   Married 1980. 1 dtr - '96 - freshman ('15) has some mental health issues but living at home and going to [facility].       [place]. Business. Engineer, maintenance (IT) for industry.       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 Baking.    Social Determinants of Health   Financial Resource Strain:   . Difficulty of Paying Living Expenses: Not on file  Food Insecurity:   . Worried About Charity fundraiser in the Last Year: Not on file  . Ran Out of Food in the Last Year: Not on file  Transportation Needs:   . Lack of Transportation (Medical): Not on file  . Lack of Transportation (Non-Medical): Not on file  Physical Activity:   . Days of Exercise per Week: Not on file  . Minutes of Exercise per Session:  Not on file  Stress:   . Feeling of Stress : Not on file  Social Connections:   . Frequency of Communication with Friends and Family: Not on file  . Frequency of Social Gatherings with Friends and Family: Not on file  . Attends Religious Services: Not on file  . Active Member of Clubs or Organizations: Not on file  . Attends Archivist Meetings: Not on file  . Marital Status: Not on file   Allergies  Allergen Reactions  . Depo-Medrol [Methylprednisolone] Hives  . Prednisone Hives  . Sulfur   . Cephalosporins Rash  . Penicillins Rash   Family History  Problem Relation Age of Onset  . Osteoporosis Mother   . Arthritis Mother   . Lung cancer Mother        died at 27.  still driving and working at church  . Kidney disease Father        amylodosis, died in 14s. Tear of aorta and was on blood thinners.   . Asthma Brother   . Heart disease Paternal Grandfather        92s  . Stroke Paternal Grandfather   . Diabetes Neg Hx   . Hyperlipidemia Neg Hx   . Hypertension Neg Hx   . Cancer Neg Hx        Current Outpatient Medications (Analgesics):  Marland Kitchen  SUMAtriptan (IMITREX) 100 MG tablet, TAKE 1 TABLET AS NEEDED FOR MIGRAINE HEADACHE AS DIRECTED (one per day and can repeat in 2 hours if persistent- max 2 total doses per day)   Current Outpatient Medications (Other):  Marland Kitchen  Calcium Carbonate-Vitamin D (CALCIUM + D PO),   .  ESTRING 2 MG vaginal ring,  .  Multiple Vitamins-Minerals (MULTIVITAMIN,TX-MINERALS) tablet, Take 1 tablet by mouth daily.   .  Travoprost, BAK Free, (TRAVATAN) 0.004 % SOLN ophthalmic solution, Place 1 drop into both eyes at bedtime.   Reviewed prior external information including notes and imaging from  primary care provider As well as notes that were available from care everywhere and other healthcare systems.  Past medical history, social, surgical and family history all reviewed in electronic medical record.  No pertanent information unless stated regarding to the chief complaint.   Review of Systems:  No headache, visual changes, nausea, vomiting, diarrhea, constipation, dizziness, abdominal pain, skin rash, fevers, chills, night sweats, weight loss, swollen lymph nodes, body aches, joint swelling, chest pain, shortness of breath, mood changes. POSITIVE muscle aches  Objective  Blood pressure 108/72, pulse 72, height 5' 4.75" (1.645 m), weight 126 lb (57.2 kg), SpO2 98 %.   General: No apparent distress alert and oriented x3 mood and affect normal, dressed appropriately.  HEENT: Pupils equal, extraocular movements intact  Respiratory: Patient's speak in full sentences and does not appear short of breath  Cardiovascular: No lower  extremity edema, non tender, no erythema  Neuro: Cranial nerves II through XII are intact, neurovascularly intact in all extremities with 2+ DTRs and 2+ pulses.  Gait normal with good balance and coordination.  MSK: Patient still has the cystic formation noted over the MCP of the first finger on the flexor side.  Nontender.  Does seem to be freely movable.  Patient does have actually over the fourth potential new cyst formation noted and patient is having thickening of the flexor tendons bilaterally over the third and fourth fingers from previous exam.   Limited musculoskeletal ultrasound was performed and interpreted by Lyndal Pulley  Limited ultrasound of patient's  right hand shows that the cyst is still approximately the same size as it was previously near the flexor tendon sheath at the A2 pulley at the MCP on the right hand of the second finger.  No abnormal blood flow noted.  Seems to be mostly compressible.  appears is superficial to the tendon sheath. Impression and Recommendations:     The above documentation has been reviewed and is accurate and complete Lyndal Pulley, DO

## 2020-07-18 NOTE — Patient Instructions (Addendum)
Hand looks good Try not to use the mouse as much Labs today See me again in 2-3 months

## 2020-07-18 NOTE — Assessment & Plan Note (Signed)
Patient does have more of a cyst noted of the hand.  We discussed with patient in great length.  I would like to get laboratory work-up with patient having a family history of amyloidosis.  We will make sure that nothing such as rheumatoid arthritis or any other inflammatory pathology could be contributing to this.  We discussed that I do not see anything that would make this more of a mass with any abnormal blood flow.  It is truly not hurting patient any more at this time.  Patient starts having unfortunately like a deep uterine contracture or pain I would like to see patient sooner.  Injections are always a possibility but will hold as long as we can.  Follow-up again 2 to 3 months total time reviewing patient's chart as well as discussing with her 31 minutes

## 2020-07-22 LAB — ANTI-DNA ANTIBODY, DOUBLE-STRANDED: ds DNA Ab: <1 IU/mL

## 2020-07-22 LAB — PROTEIN ELECTROPHORESIS, SERUM
Albumin ELP: 4.5 g/dL (ref 3.8–4.8)
Alpha 1: 0.3 g/dL (ref 0.2–0.3)
Alpha 2: 0.7 g/dL (ref 0.5–0.9)
Beta 2: 0.3 g/dL (ref 0.2–0.5)
Beta Globulin: 0.4 g/dL (ref 0.4–0.6)
Gamma Globulin: 1.2 g/dL (ref 0.8–1.7)
Total Protein: 7.4 g/dL (ref 6.1–8.1)

## 2020-07-22 LAB — ANTI-NUCLEAR AB-TITER (ANA TITER): ANA Titer 1: 1:40 {titer} — ABNORMAL HIGH

## 2020-07-22 LAB — ANA: Anti Nuclear Antibody (ANA): POSITIVE — AB

## 2020-07-22 LAB — RHEUMATOID FACTOR: Rheumatoid fact SerPl-aCnc: <14 IU/mL (ref <14)

## 2020-10-07 ENCOUNTER — Encounter: Payer: Self-pay | Admitting: Family Medicine

## 2020-10-09 NOTE — Progress Notes (Signed)
[facility] [place] Phone: [phone] Subjective:   Melinda Suarez, am serving as a scribe for Dr. Hulan Saas.  This visit occurred during the SARS-CoV-2 public health emergency.  Safety protocols were in place, including screening questions prior to the visit, additional usage of staff PPE, and extensive cleaning of exam room while observing appropriate contact time as indicated for disinfecting solutions.   I'm seeing this patient by the request  of:  Marin Olp, MD  CC: Hand follow-up  JIR:[id number]   07/18/2020 Patient does have more of a cyst noted of the hand.  We discussed with patient in great length.  I would like to get laboratory work-up with patient having a family history of amyloidosis.  We will make sure that nothing such as rheumatoid arthritis or any other inflammatory pathology could be contributing to this.  We discussed that I do not see anything that would make this more of a mass with any abnormal blood flow.  It is truly not hurting patient any more at this time.  Patient starts having unfortunately like a deep uterine contracture or pain I would like to see patient sooner.  Injections are always a possibility but will hold as long as we can.  Follow-up again 2 to 3 months total time reviewing patient's chart as well as discussing with her 31 minutes  Update 10/10/2020 Melinda Suarez is a 65 y.o. female coming in with complaint of right hand pain. Patient states that she has some new cysts on her hand but still no pain.  Patient states that it does seem to be expanding.  Patient has been trying to avoid clicking as much as she has on the computer but still having the difficulty.  Patient though states no locking of the fingers, no giving out on her, patient denies any numbness.  Denies any weakness.      Past Medical History:  Diagnosis Date  . Ganglion cyst   . Migraine headache    Past  Surgical History:  Procedure Laterality Date  . BREAST CYST ASPIRATION     benign in 2011  . GANGLION CYST EXCISION     left hand; left handed   Social History   Socioeconomic History  . Marital status: Married    Spouse name: Not on file  . Number of children: 1  . Years of education: 41  . Highest education level: Not on file  Occupational History  . 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 Smoking status: Never Smoker  . Smokeless tobacco: Never Used  Substance and Sexual Activity  . Alcohol use: Yes    Alcohol/week: 7.0 standard drinks    Types: 7 Standard drinks or equivalent per week  . Drug use: No  . Sexual activity: Yes    Partners: Male  Other Topics Concern  . Not on file  Social History Narrative   Married 1980. 1 dtr - '96 - freshman ('15) has some mental health issues but living at home and going to [facility].       [place]. Business. Engineer, maintenance (IT) for industry.       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    Social Determinants of Health   Financial Resource Strain: Not on file  Food Insecurity: Not on file  Transportation Needs: Not on file  Physical Activity: Not on file  Stress: Not on file  Social Connections: Not on file   Allergies  Allergen Reactions  . Depo-Medrol [Methylprednisolone] Hives  . Elemental Sulfur   . Prednisone Hives  . Cephalosporins Rash  . Penicillins Rash   Family History  Problem Relation Age of Onset  . Osteoporosis Mother   . Arthritis Mother   . Lung cancer Mother        died at 42. still driving and working at church  . Kidney disease Father        amylodosis, died in 17s. Tear of aorta and was on blood thinners.   . Asthma Brother   . Heart disease Paternal Grandfather        28s  . Stroke Paternal Grandfather   . Diabetes Neg Hx   . Hyperlipidemia Neg Hx   . Hypertension Neg Hx   . Cancer Neg Hx         Current Outpatient Medications (Analgesics):  Marland Kitchen  SUMAtriptan (IMITREX) 100 MG tablet, TAKE 1 TABLET AS NEEDED FOR MIGRAINE HEADACHE AS DIRECTED (one per day and can repeat in 2 hours if persistent- max 2 total doses per day)   Current Outpatient Medications (Other):  Marland Kitchen  Calcium Carbonate-Vitamin D (CALCIUM + D PO),   .  ESTRING 2 MG vaginal ring,  .  Multiple Vitamins-Minerals (MULTIVITAMIN,TX-MINERALS) tablet, Take 1 tablet by mouth daily. .  Travoprost, BAK Free, (TRAVATAN) 0.004 % SOLN ophthalmic solution, Place 1 drop into both eyes at bedtime.   Reviewed prior external information including notes and imaging from  primary care provider As well as notes that were available from care everywhere and other healthcare systems.  Past medical history, social, surgical and family history all reviewed in electronic medical record.  No pertanent information unless stated regarding to the chief complaint.   Review of Systems:  No headache, visual changes, nausea, vomiting, diarrhea, constipation, dizziness, abdominal pain, skin rash, fevers, chills, night sweats, weight loss, swollen lymph nodes, body aches, joint swelling, chest pain, shortness of breath, mood changes. POSITIVE muscle aches  Objective  Blood pressure 100/60, pulse 72, height 5' 4.75" (1.645 m), weight 113 lb (51.3 kg), SpO2 96 %.   General: No apparent distress alert and oriented x3 mood and affect normal, dressed appropriately.  HEENT: Pupils equal, extraocular movements intact  Respiratory: Patient's speak in full sentences and does not appear short of breath  Cardiovascular: No lower extremity edema, non tender, no erythema  Gait normal with good balance and coordination.  MSK:  Non tender with full range of motion and good stability and symmetric strength and tone of shoulders, elbows, wrist, hip, knee and ankles bilaterally.  Hand exam shows the patient actually does have mild tightness noted of the flexor  tendons but patient has full range of motion of the fingers.  Patient does have more cystic formation noted over the palmar aspect of the tenderness near the A2 pulley.  Nontender though on exam.  No abnormal blood flow or discoloration of the fingers.  Limited musculoskeletal ultrasound was performed and interpreted by Lyndal Pulley  Limited ultrasound shows that patient does have limited ultrasound shows patient does have some changes now noted over the flexor tendons of the most of the hands at this point.  Does not feel appear that they are connected.  Difficult to assess for sure.  No abnormality of any of the bones.  No abnormal blood flow noted.    Impression and Recommendations:     The  above documentation has been reviewed and is accurate and complete Lyndal Pulley, DO

## 2020-10-10 ENCOUNTER — Other Ambulatory Visit: Payer: Self-pay

## 2020-10-10 ENCOUNTER — Ambulatory Visit: Payer: Self-pay

## 2020-10-10 ENCOUNTER — Ambulatory Visit: Payer: Medicare PPO | Admitting: Family Medicine

## 2020-10-10 ENCOUNTER — Ambulatory Visit (INDEPENDENT_AMBULATORY_CARE_PROVIDER_SITE_OTHER): Payer: Medicare PPO

## 2020-10-10 ENCOUNTER — Encounter: Payer: Self-pay | Admitting: Family Medicine

## 2020-10-10 ENCOUNTER — Encounter: Payer: BC Managed Care – PPO | Admitting: Family Medicine

## 2020-10-10 VITALS — BP 100/60 | HR 72 | Ht 64.75 in | Wt 113.0 lb

## 2020-10-10 DIAGNOSIS — M71341 Other bursal cyst, right hand: Secondary | ICD-10-CM | POA: Diagnosis not present

## 2020-10-10 DIAGNOSIS — M79641 Pain in right hand: Secondary | ICD-10-CM

## 2020-10-10 NOTE — Assessment & Plan Note (Signed)
Patient has not had any extension of this over the course of the palmar aspect of the hand.  This seems to be all across the metacarpal areas.  Patient is having some mild tightening it appears that some of the flexor tendons as well.  Because of this I do feel that advanced imaging is warranted.  Patient is still having no pain, no erythema, no numbness.  Seems to be doing relatively well otherwise but does have a family history of amyloidosis.  ANA was positive but very low titer.  If still inconclusive after the MRI will need to consider the possibility of rheumatology referral as well as hand specialist.  Patient understands this verbalized understanding.

## 2020-10-10 NOTE — Patient Instructions (Addendum)
Good to see you  Xray on the way out  MRI of right hand Will write you in mychart and we will discuss possible referral to hand specialist or rheumatologist

## 2020-10-12 NOTE — Progress Notes (Signed)
Phone [phone]   Subjective:  Patient presents today for their annual physical. Chief complaint-noted.   See problem oriented charting- ROS- full  review of systems was completed and negative except for: eye twitching- less severe (worse with stress)- not as bad as July visit- normal head CT- only 1 cup of coffee a day  The following were reviewed and entered/updated in epic: Past Medical History:  Diagnosis Date  . Ganglion cyst   . Migraine headache    Patient Active Problem List   Diagnosis Date Noted  . Hyperlipidemia 07/18/2014    Priority: Medium  . Osteoporosis 06/04/2014    Priority: Medium  . Migraines 06/04/2014    Priority: Medium  . Caregiver stress 08/20/2016    Priority: Low  . Glaucoma 06/04/2014    Priority: Low  . History of disease of skin and subcutaneous tissue 07/02/2007    Priority: Low  . Other bursal cyst, right hand 04/18/2020   Past Surgical History:  Procedure Laterality Date  . BREAST CYST ASPIRATION     benign in 2011  . GANGLION CYST EXCISION     left hand; left handed    Family History  Problem Relation Age of Onset  . Osteoporosis Mother   . Arthritis Mother   . Lung cancer Mother        died at 56. still driving and working at church  . Kidney disease Father        amylodosis, died in 29s. Tear of aorta and was on blood thinners.   . Asthma Brother   . Heart disease Paternal Grandfather        75s  . Stroke Paternal Grandfather   . Diabetes Neg Hx   . Hyperlipidemia Neg Hx   . Hypertension Neg Hx   . Cancer Neg Hx     Medications- reviewed and updated Current Outpatient Medications  Medication Sig Dispense Refill  . Calcium Carbonate-Vitamin D (CALCIUM + D PO)      . ESTRING 2 MG vaginal ring     . latanoprost (XALATAN) 0.005 % ophthalmic solution SMARTSIG:In Eye(s)    . Multiple Vitamins-Minerals (MULTIVITAMIN,TX-MINERALS) tablet Take 1 tablet by mouth daily.    . SUMAtriptan (IMITREX) 100 MG tablet TAKE 1 TABLET  AS NEEDED FOR MIGRAINE HEADACHE AS DIRECTED (one per day and can repeat in 2 hours if persistent- max 2 total doses per day) 8 tablet 11   No current facility-administered medications for this visit.    Allergies-reviewed and updated Allergies  Allergen Reactions  . Depo-Medrol [Methylprednisolone] Hives  . Elemental Sulfur   . Prednisone Hives  . Cephalosporins Rash  . Penicillins Rash    Social History   Social History Narrative   Married 1980. 1 dtr - '96 -[place]. Business. Engineer, maintenance (IT) for industry.       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    Objective  Objective:  BP 122/73   Pulse 71   Temp (!) 97.5 F (36.4 C) (Temporal)   Ht 5\' 5"  (1.651 m)   Wt 110 lb 12.8 oz (50.3 kg)   SpO2 98%   BMI 18.44 kg/m  Gen: NAD, resting comfortably HEENT: Mucous membranes are moist. Oropharynx normal Neck: no thyromegaly CV: RRR no murmurs rubs or gallops Lungs: CTAB no crackles, wheeze, rhonchi Abdomen: soft/nontender/nondistended/normal bowel sounds. No rebound or guarding.  Ext: no edema Skin: warm, dry Neuro: grossly normal, moves all extremities, PERRLA  Assessment and Plan   65 y.o. female presenting for annual physical.  Health Maintenance counseling: 1. Anticipatory guidance: Patient counseled regarding regular dental exams -q6 months, eye exams-every 6 months due to glaucoma,  avoiding smoking and second hand smoke , limiting alcohol to 1 beverage per day- 7 per week .   2. Risk factor reduction:  Advised patient of need for regular exercise and diet rich and fruits and vegetables to reduce risk of heart attack and stroke. 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 Diet- reasonably healthy- limits carb intake- discussed  could loosen some- I actually preferred weight last year of 126 Wt Readings from Last 3 Encounters:  10/14/20 110 lb 12.8 oz (50.3 kg)  10/10/20 113 lb (51.3 kg)  07/18/20 126 lb (57.2 kg)  3. Immunizations/screenings/ancillary studies-flu shot already given.  Discussed Shingrix- discussed at pharmacy- her plan may allow here she thinks.  Covid-19 booster- already had  Immunization History  Administered Date(s) Administered  . Influenza,inj,Quad PF,6+ Mos 06/13/2015, 09/21/2017, 06/20/2019  . Influenza-Unspecified 06/07/2013, 06/30/2016, 08/02/2018, 05/31/2020  . PFIZER(Purple Top)SARS-COV-2 Vaccination 11/20/2019, 12/11/2019, 08/01/2020  . Td 09/01/2001  . Tdap 06/02/2012  4. Cervical cancer screening- -follows with Dr. Valentino Saxon of [facility]-last on file 08/06/2017 but HPV negative - she had repeat this year 5. Breast cancer screening-  breast exam with Dr. Valentino Saxon and mammogram 04/02/20 6. Colon cancer screening - normal Cologuard October 17, 2018 with plan for 3-year repeat 7. Skin cancer screening-as needed dermatology visits. advised regular sunscreen use or covering up. Denies worrisome, changing, or new skin lesions.  8. Birth control/STD check- postmenopausal/monogamous 9. Osteoporosis screening at 59- see discussion below -Never smoker  Status of chronic or acute concerns   # Osteoporosis S: Last DEXA: done feb 7th (we will await records- she asked them to send to [place]) Last on file November 2017  Medication (bisphosphonate or prolia): Not currently on bisphosphonate  Calcium: 1200mg  (through diet ok) recommended -patient takes Caltrate Vitamin D: 1000 units a day recommended-patient takes extra vitamin D-levels have been excellent  A/P: hopefully controlled- get records   #hyperlipidemia S: Medication:None. -10-year ASCVD risk has been below 3% typically  A/P: hopefully still in similar range- update today with labs  #Migraines S: Patient continues to take  approximately 10 Imitrex per year -No changes in migraine pattern  A/P: Stable. Continue current medications.    % Glaucoma-continue twice yearly follow-up with ophthalmology. On latanoprost  % Working with Dr. Tamala Julian on right hand nodules-upcoming MRI planned.  We referred her back in July 2021  #see ROS about eye twitching- also 03/11/20 visit  Recommended follow up: Return in about 1 year (around 10/14/2021) for physical or sooner if needed. Future Appointments  Date Time Provider [place]  10/29/2020  7:00 AM GI-315 MR 3 [id number] GI[facility] [address] [facility]   Lab/Order associations: fasting   ICD-10-CM   1. Preventative health care  Z00.00 CBC with Differential/Platelet    Comprehensive metabolic panel    Lipid panel    VITAMIN D 25 Hydroxy (Vit-D Deficiency, Fractures)  2. Hyperlipidemia, unspecified hyperlipidemia type  E78.5 CBC with Differential/Platelet    Comprehensive metabolic panel    Lipid panel  3. Age-related osteoporosis without current pathological fracture  M81.0 VITAMIN D 25 Hydroxy (Vit-D Deficiency, Fractures)  No orders of the defined types were placed in this encounter.   Return precautions advised.  Garret Reddish, MD

## 2020-10-12 NOTE — Patient Instructions (Addendum)
Double check with insurance about shingrix- we strongly prefer not to give in office due to some prior patients getting bills but if they can confirm then we are willing to give. If not, consider at pharmacy  Please stop by lab before you go If you have mychart- we will send your results within 3 business days of [place] receiving them.  If you do not have mychart- we will call you about results within 5 business days of [place] receiving them.  *please also note that you will see labs on mychart as soon as they post. I will later go in and write notes on them- will say "notes from Dr. Yong Channel"  Recommended follow up: Return in about 1 year (around 10/14/2021) for physical or sooner if needed.

## 2020-10-14 ENCOUNTER — Other Ambulatory Visit: Payer: Self-pay

## 2020-10-14 ENCOUNTER — Encounter: Payer: Self-pay | Admitting: Family Medicine

## 2020-10-14 ENCOUNTER — Ambulatory Visit (INDEPENDENT_AMBULATORY_CARE_PROVIDER_SITE_OTHER): Payer: Medicare PPO | Admitting: Family Medicine

## 2020-10-14 VITALS — BP 122/73 | HR 71 | Temp 97.5°F | Ht 65.0 in | Wt 110.8 lb

## 2020-10-14 DIAGNOSIS — Z0000 Encounter for general adult medical examination without abnormal findings: Secondary | ICD-10-CM | POA: Diagnosis not present

## 2020-10-14 DIAGNOSIS — M810 Age-related osteoporosis without current pathological fracture: Secondary | ICD-10-CM

## 2020-10-14 DIAGNOSIS — E785 Hyperlipidemia, unspecified: Secondary | ICD-10-CM | POA: Diagnosis not present

## 2020-10-14 LAB — COMPREHENSIVE METABOLIC PANEL
ALT: 21 U/L (ref 0–35)
AST: 28 U/L (ref 0–37)
Albumin: 4.4 g/dL (ref 3.5–5.2)
Alkaline Phosphatase: 57 U/L (ref 39–117)
BUN: 17 mg/dL (ref 6–23)
CO2: 33 mEq/L — ABNORMAL HIGH (ref 19–32)
Calcium: 9.5 mg/dL (ref 8.4–10.5)
Chloride: 98 mEq/L (ref 96–112)
Creatinine, Ser: 0.79 mg/dL (ref 0.40–1.20)
GFR: 78.75 mL/min (ref >60.00)
Glucose, Bld: 87 mg/dL (ref 70–99)
Potassium: 4.4 mEq/L (ref 3.5–5.1)
Sodium: 139 mEq/L (ref 135–145)
Total Bilirubin: 0.6 mg/dL (ref 0.2–1.2)
Total Protein: 7.7 g/dL (ref 6.0–8.3)

## 2020-10-14 LAB — CBC WITH DIFFERENTIAL/PLATELET
Basophils Absolute: 0.1 10*3/uL (ref 0.0–0.1)
Basophils Relative: 1.3 % (ref 0.0–3.0)
Eosinophils Absolute: 0.3 10*3/uL (ref 0.0–0.7)
Eosinophils Relative: 7.6 % — ABNORMAL HIGH (ref 0.0–5.0)
HCT: 39.8 % (ref 36.0–46.0)
Hemoglobin: 13.3 g/dL (ref 12.0–15.0)
Lymphocytes Relative: 35.6 % (ref 12.0–46.0)
Lymphs Abs: 1.4 10*3/uL (ref 0.7–4.0)
MCHC: 33.3 g/dL (ref 30.0–36.0)
MCV: 95.1 fl (ref 78.0–100.0)
Monocytes Absolute: 0.5 10*3/uL (ref 0.1–1.0)
Monocytes Relative: 11.8 % (ref 3.0–12.0)
Neutro Abs: 1.7 10*3/uL (ref 1.4–7.7)
Neutrophils Relative %: 43.7 % (ref 43.0–77.0)
Platelets: 332 10*3/uL (ref 150.0–400.0)
RBC: 4.19 Mil/uL (ref 3.87–5.11)
RDW: 13.8 % (ref 11.5–15.5)
WBC: 3.8 10*3/uL — ABNORMAL LOW (ref 4.0–10.5)

## 2020-10-14 LAB — LIPID PANEL
Cholesterol: 216 mg/dL — ABNORMAL HIGH (ref 0–200)
HDL: 93.3 mg/dL (ref >39.00)
LDL Cholesterol: 109 mg/dL — ABNORMAL HIGH (ref 0–99)
NonHDL: 122.78
Total CHOL/HDL Ratio: 2
Triglycerides: 71 mg/dL (ref 0.0–149.0)
VLDL: 14.2 mg/dL (ref 0.0–40.0)

## 2020-10-14 LAB — VITAMIN D 25 HYDROXY (VIT D DEFICIENCY, FRACTURES): VITD: 64.35 ng/mL (ref 30.00–100.00)

## 2020-10-29 ENCOUNTER — Other Ambulatory Visit: Payer: Self-pay

## 2020-10-29 ENCOUNTER — Ambulatory Visit
Admission: RE | Admit: 2020-10-29 | Discharge: 2020-10-29 | Disposition: A | Discharge status: Home / Self Care | Payer: Medicare PPO | Source: Ambulatory Visit | Attending: Family Medicine | Admitting: Family Medicine

## 2020-10-29 DIAGNOSIS — M79641 Pain in right hand: Secondary | ICD-10-CM

## 2020-12-04 NOTE — Progress Notes (Signed)
[place] [address] [place] Phone: [phone] Subjective:   I Melinda Suarez am serving as a Education administrator for Dr. Hulan Saas.   This visit occurred during the SARS-CoV-2 public health emergency.  Safety protocols were in place, including screening questions prior to the visit, additional usage of staff PPE, and extensive cleaning of exam room while observing appropriate contact time as indicated for disinfecting solutions.   I'm seeing this patient by the request  of:  Marin Olp, MD  CC: Left shoulder pain  [id number]  Melinda Suarez is a 65 y.o. female coming in with complaint of left shoulder pain. Last seen in February for R hand pain. Patient states she slept in a weird position. History of tendonitis. Slight loss of ROM.  Onset- Chronic  Location - Joint pain that radiates down the arm Character-sharp Aggravating factors- extension Therapies tried- ice, heat, topical and oral Severity-5 out of 10     Past Medical History:  Diagnosis Date  . Ganglion cyst   . Migraine headache    Past Surgical History:  Procedure Laterality Date  . BREAST CYST ASPIRATION     benign in 2011  . GANGLION CYST EXCISION     left hand; left handed   Social History   Socioeconomic History  . Marital status: Married    Spouse name: Not on file  . Number of children: 1  . Years of education: 33  . Highest education level: Not on file  Occupational History  . 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 Smoking status: Never Smoker  . Smokeless tobacco: Never Used  Substance and Sexual Activity  . Alcohol use: Yes    Alcohol/week: 7.0 standard drinks    Types: 7 Standard drinks or equivalent per week  . Drug use: No  . Sexual activity: Yes    Partners: Male  Other Topics Concern  . Not on file  Social History Narrative   Married 1980. 1 dtr - '96 -Melinda Suarez      College- [employer].  Business. Engineer, maintenance (IT) for industry.       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    Social Determinants of Health   Financial Resource Strain: Not on file  Food Insecurity: Not on file  Transportation Needs: Not on file  Physical Activity: Not on file  Stress: Not on file  Social Connections: Not on file   Allergies  Allergen Reactions  . Depo-Medrol [Methylprednisolone] Hives  . Elemental Sulfur   . Prednisone Hives  . Cephalosporins Rash  . Penicillins Rash   Family History  Problem Relation Age of Onset  . Osteoporosis Mother   . Arthritis Mother   . Lung cancer Mother        died at 79. still driving and working at church  . Kidney disease Father        amylodosis, died in 54s. Tear of aorta and was on blood thinners.   . Asthma Brother   . Heart disease Paternal Grandfather        58s  . Stroke Paternal Grandfather   . Diabetes Neg Hx   . Hyperlipidemia Neg Hx   . Hypertension Neg Hx   . Cancer Neg Hx        Current Outpatient Medications (Analgesics):  Marland Kitchen  SUMAtriptan (IMITREX) 100 MG tablet, TAKE 1 TABLET AS NEEDED FOR MIGRAINE HEADACHE AS DIRECTED (  one per day and can repeat in 2 hours if persistent- max 2 total doses per day)   Current Outpatient Medications (Other):  Marland Kitchen  Calcium Carbonate-Vitamin D (CALCIUM + D PO),   .  ESTRING 2 MG vaginal ring,  .  latanoprost (XALATAN) 0.005 % ophthalmic solution, SMARTSIG:In Eye(s) .  Multiple Vitamins-Minerals (MULTIVITAMIN,TX-MINERALS) tablet, Take 1 tablet by mouth daily.   Reviewed prior external information including notes and imaging from  primary care provider As well as notes that were available from care everywhere and other healthcare systems.  Past medical history, social, surgical and family history all reviewed in electronic medical record.  No pertanent information unless stated regarding to the chief complaint.   Review of Systems:  No headache, visual  changes, nausea, vomiting, diarrhea, constipation, dizziness, abdominal pain, skin rash, fevers, chills, night sweats, weight loss, swollen lymph nodes, body aches, joint swelling, chest pain, shortness of breath, mood changes. POSITIVE muscle aches  Objective  Blood pressure 96/62, pulse 79, height 5\' 5"  (1.651 m), weight 113 lb (51.3 kg), SpO2 97 %.   General: No apparent distress alert and oriented x3 mood and affect normal, dressed appropriately.  HEENT: Pupils equal, extraocular movements intact  Respiratory: Patient's speak in full sentences and does not appear short of breath  Cardiovascular: No lower extremity edema, non tender, no erythema  Gait normal with good balance and coordination.  MSK: Left shoulder exam shows the patient does have some limited internal range of motion to sacrum.  External rotation motion seems to be full.  Patient does have very mild positive impingement.  Negative empty can.  Good strength of the rotator cuff noted.  Limited musculoskeletal ultrasound was performed and interpreted by Lyndal Pulley  Limited ultrasound of patient's left shoulder shows the patient does have some mild thickening of the anterior capsule noted.  Patient also has some very mild hypoechoic changes of the glenohumeral joint with potential mild gouty deposits noted.  Rotator cuff still appears to be intact with no significant abnormalities. Impression: Questionable early frozen shoulder possible small effusion of the glenohumeral joint and gouty deposits    Impression and Recommendations:     The above documentation has been reviewed and is accurate and complete Lyndal Pulley, DO

## 2020-12-05 ENCOUNTER — Ambulatory Visit: Payer: Self-pay

## 2020-12-05 ENCOUNTER — Ambulatory Visit (INDEPENDENT_AMBULATORY_CARE_PROVIDER_SITE_OTHER): Payer: Medicare PPO

## 2020-12-05 ENCOUNTER — Other Ambulatory Visit: Payer: Self-pay

## 2020-12-05 ENCOUNTER — Ambulatory Visit: Payer: Medicare PPO | Admitting: Family Medicine

## 2020-12-05 ENCOUNTER — Encounter: Payer: Self-pay | Admitting: Family Medicine

## 2020-12-05 VITALS — BP 96/62 | HR 79 | Ht 65.0 in | Wt 113.0 lb

## 2020-12-05 DIAGNOSIS — M25512 Pain in left shoulder: Secondary | ICD-10-CM | POA: Diagnosis not present

## 2020-12-05 DIAGNOSIS — M7502 Adhesive capsulitis of left shoulder: Secondary | ICD-10-CM | POA: Diagnosis not present

## 2020-12-05 DIAGNOSIS — G8929 Other chronic pain: Secondary | ICD-10-CM

## 2020-12-05 DIAGNOSIS — M47812 Spondylosis without myelopathy or radiculopathy, cervical region: Secondary | ICD-10-CM | POA: Diagnosis not present

## 2020-12-05 NOTE — Assessment & Plan Note (Signed)
Patient has had a history of tendinitis but on the ultrasound do not see any true tendinitis at this time.  Patient does have what appears to be possibly early frozen shoulder.  We discussed formal physical therapy and will be referred.  X-rays of the shoulder and neck ordered today as well.  Discussed continuing vitamin D supplementation.  Follow-up with me again in 6 weeks.  Worsening symptoms consider injection.

## 2020-12-05 NOTE — Patient Instructions (Addendum)
Good to see you Shoulder and neck xray today PT Kelly Splinter will call you Tart cherry extract 1200mg  at night Exercises 3x  See me again in 6 weeks

## 2020-12-06 ENCOUNTER — Other Ambulatory Visit: Payer: Self-pay | Admitting: Family Medicine

## 2021-01-01 DIAGNOSIS — M7502 Adhesive capsulitis of left shoulder: Secondary | ICD-10-CM | POA: Diagnosis not present

## 2021-01-06 DIAGNOSIS — M7502 Adhesive capsulitis of left shoulder: Secondary | ICD-10-CM | POA: Diagnosis not present

## 2021-01-08 DIAGNOSIS — M7502 Adhesive capsulitis of left shoulder: Secondary | ICD-10-CM | POA: Diagnosis not present

## 2021-01-14 DIAGNOSIS — M7502 Adhesive capsulitis of left shoulder: Secondary | ICD-10-CM | POA: Diagnosis not present

## 2021-01-16 DIAGNOSIS — M7502 Adhesive capsulitis of left shoulder: Secondary | ICD-10-CM | POA: Diagnosis not present

## 2021-01-16 NOTE — Progress Notes (Signed)
[place] [address] [place] Phone: [phone] Subjective:   I Melinda Suarez am serving as a Education administrator for Dr. Hulan Saas.  This visit occurred during the SARS-CoV-2 public health emergency.  Safety protocols were in place, including screening questions prior to the visit, additional usage of staff PPE, and extensive cleaning of exam room while observing appropriate contact time as indicated for disinfecting solutions.   I'm seeing this patient by the request  of:  Marin Olp, MD  CC: shoulder pain follow up   [id number]   12/05/2020 Patient has had a history of tendinitis but on the ultrasound do not see any true tendinitis at this time.  Patient does have what appears to be possibly early frozen shoulder.  We discussed formal physical therapy and will be referred.  X-rays of the shoulder and neck ordered today as well.  Discussed continuing vitamin D supplementation.  Follow-up with me again in 6 weeks.  Worsening symptoms consider injection.  Update 01/21/2021 Melinda Suarez is a 65 y.o. female coming in with complaint of L shoulder pain. Patient states that PT has been helping a lot. Still working on ROM. Overall doing better.   Xray cervical 12/05/2020 IMPRESSION: Mild cervical spondylosis. No acute findings  Xray L shoulder 12/05/2020  IMPRESSION: Negative shoulder radiographs    Past Medical History:  Diagnosis Date  . Ganglion cyst   . Migraine headache    Past Surgical History:  Procedure Laterality Date  . BREAST CYST ASPIRATION     benign in 2011  . GANGLION CYST EXCISION     left hand; left handed   Social History   Socioeconomic History  . Marital status: Married    Spouse name: Not on file  . Number of children: 1  . Years of education: 24  . Highest education level: Not on file  Occupational History  . 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 Smoking status:  Never Smoker  . Smokeless tobacco: Never Used  Substance and Sexual Activity  . Alcohol use: Yes    Alcohol/week: 7.0 standard drinks    Types: 7 Standard drinks or equivalent per week  . Drug use: No  . Sexual activity: Yes    Partners: Male  Other Topics Concern  . Not on file  Social History Narrative   Married 1980. 1 dtr - '96 -Melinda Suarez      College- [employer]. Business. Engineer, maintenance (IT) for industry.       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    Social Determinants of Health   Financial Resource Strain: Not on file  Food Insecurity: Not on file  Transportation Needs: Not on file  Physical Activity: Not on file  Stress: Not on file  Social Connections: Not on file   Allergies  Allergen Reactions  . Depo-Medrol [Methylprednisolone] Hives  . Elemental Sulfur   . Prednisone Hives  . Cephalosporins Rash  . Penicillins Rash   Family History  Problem Relation Age of Onset  . Osteoporosis Mother   . Arthritis Mother   . Lung cancer Mother        died at 41. still driving and working at church  . Kidney disease Father        amylodosis, died in 45s. Tear of aorta and was on blood thinners.   . Asthma Brother   . Heart disease Paternal Grandfather  29s  . Stroke Paternal Grandfather   . Diabetes Neg Hx   . Hyperlipidemia Neg Hx   . Hypertension Neg Hx   . Cancer Neg Hx        Current Outpatient Medications (Analgesics):  Marland Kitchen  SUMAtriptan (IMITREX) 100 MG tablet, 1 TABLET FOR MIGRAINE HEADACHE & CAN REPEAT IN 2 HRS IF PERSISTENT MAX OF 2/DAY   Current Outpatient Medications (Other):  Marland Kitchen  Calcium Carbonate-Vitamin D (CALCIUM + D PO),   .  ESTRING 2 MG vaginal ring,  .  latanoprost (XALATAN) 0.005 % ophthalmic solution, SMARTSIG:In Eye(s) .  Multiple Vitamins-Minerals (MULTIVITAMIN,TX-MINERALS) tablet, Take 1 tablet by mouth daily.   Reviewed prior external information including notes and imaging from  primary  care provider As well as notes that were available from care everywhere and other healthcare systems.  Past medical history, social, surgical and family history all reviewed in electronic medical record.  No pertanent information unless stated regarding to the chief complaint.   Review of Systems:  No headache, visual changes, nausea, vomiting, diarrhea, constipation, dizziness, abdominal pain, skin rash, fevers, chills, night sweats, weight loss, swollen lymph nodes, body aches, joint swelling, chest pain, shortness of breath, mood changes.  Objective  Blood pressure 104/62, pulse 77, height 5\' 5"  (1.651 m), weight 113 lb (51.3 kg), SpO2 97 %.   General: No apparent distress alert and oriented x3 mood and affect normal, dressed appropriately.  HEENT: Pupils equal, extraocular movements intact  Respiratory: Patient's speak in full sentences and does not appear short of breath  Cardiovascular: No lower extremity edema, non tender, no erythema  Gait normal with good balance and coordination.  MSK: Left shoulder exam shows the patient does have good strength noted to the rotator cuff.  Patient does have though still some limited internal range of motion to sacrum.  External range of motion is improved in the forward flexion is nearly complete compared to the contralateral side.  No crepitus noted.    Impression and Recommendations:     The above documentation has been reviewed and is accurate and complete Lyndal Pulley, DO

## 2021-01-21 ENCOUNTER — Other Ambulatory Visit: Payer: Self-pay

## 2021-01-21 ENCOUNTER — Ambulatory Visit: Payer: Medicare PPO | Admitting: Family Medicine

## 2021-01-21 ENCOUNTER — Encounter: Payer: Self-pay | Admitting: Family Medicine

## 2021-01-21 DIAGNOSIS — M25512 Pain in left shoulder: Secondary | ICD-10-CM | POA: Diagnosis not present

## 2021-01-21 DIAGNOSIS — M7502 Adhesive capsulitis of left shoulder: Secondary | ICD-10-CM | POA: Diagnosis not present

## 2021-01-21 NOTE — Patient Instructions (Signed)
Good to see you Made significant improvement Continue PT and exercises if they are helpful See me again in 2-3 months to see if it is completely resolved

## 2021-01-21 NOTE — Assessment & Plan Note (Signed)
Do believe the patient's left shoulder pain is secondary to more of an early frozen shoulder but patient has made significant improvement.  Patient still has some discomfort with internal range of motion.  Rotator cuff strength does seem to be intact.  Encourage patient to continue to work with physical therapist.  Patient encouraged to see me again in 2 to 3 months.  At that time if continuing to have any difficulty would consider the possibility of injections or advanced imaging.

## 2021-01-23 DIAGNOSIS — M7502 Adhesive capsulitis of left shoulder: Secondary | ICD-10-CM | POA: Diagnosis not present

## 2021-01-28 DIAGNOSIS — M7502 Adhesive capsulitis of left shoulder: Secondary | ICD-10-CM | POA: Diagnosis not present

## 2021-01-30 DIAGNOSIS — M7502 Adhesive capsulitis of left shoulder: Secondary | ICD-10-CM | POA: Diagnosis not present

## 2021-02-05 DIAGNOSIS — M7502 Adhesive capsulitis of left shoulder: Secondary | ICD-10-CM | POA: Diagnosis not present

## 2021-02-12 DIAGNOSIS — M7502 Adhesive capsulitis of left shoulder: Secondary | ICD-10-CM | POA: Diagnosis not present

## 2021-02-19 DIAGNOSIS — M7502 Adhesive capsulitis of left shoulder: Secondary | ICD-10-CM | POA: Diagnosis not present

## 2021-02-26 DIAGNOSIS — M7502 Adhesive capsulitis of left shoulder: Secondary | ICD-10-CM | POA: Diagnosis not present

## 2021-03-19 NOTE — Progress Notes (Signed)
[place] Su[place] Phone: [phone] Subjective:   I Melinda Suarez am serving as a Education administrator for Dr. Hulan Saas.  This visit occurred during the SARS-CoV-2 public health emergency.  Safety protocols were in place, including screening questions prior to the visit, additional usage of staff PPE, and extensive cleaning of exam room while observing appropriate contact time as indicated for disinfecting solutions.   I'm seeing this patient by the request  of:  Marin Olp, MD  CC: Left shoulder pain follow-up  [id number]  01/21/2021  Do believe the patient's left shoulder pain is secondary to more of an early frozen shoulder but patient has made significant improvement.  Patient still has some discomfort with internal range of motion.  Rotator cuff strength does seem to be intact.  Encourage patient to continue to work with physical therapist.  Patient encouraged to see me again in 2 to 3 months.  At that time if continuing to have any difficulty would consider the possibility of injections or advanced imaging.  Update 03/24/2021 Melinda Suarez is a 65 y.o. female coming in with complaint of L shoulder pain. Patient states the shoulder is better. Finished PT but continuing the exercises.  Patient states that she is feeling 65 to 75% better.  Still has some limited range of motion.  Not much pain now.  Able to do daily activities relatively well.      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 1 dtr - '96 -[place]. Business. Engineer, maintenance (IT) for industry.       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    Social Determinants of Health   Financial Resource Strain: Not on file  Food Insecurity: Not on file  Transportation Needs: Not on file  Physical Activity: Not on file  Stress: Not on file  Social Connections: Not on file   Allergies  Allergen Reactions   Depo-Medrol [Methylprednisolone] Hives   Elemental Sulfur    Prednisone Hives   Cephalosporins Rash   Penicillins Rash   Family History  Problem Relation Age of Onset   Osteoporosis Mother    Arthritis Mother    Lung cancer Mother        died at 1. still driving and working at church   Kidney disease Father        amylodosis, died in 66s. Tear of aorta and was on  blood thinners.    Asthma Brother    Heart disease Paternal Grandfather        73s   Stroke Paternal Grandfather    Diabetes Neg Hx    Hyperlipidemia Neg Hx    Hypertension Neg Hx    Cancer Neg Hx        Current Outpatient Medications (Analgesics):    SUMAtriptan (IMITREX) 100 MG tablet, 1 TABLET FOR MIGRAINE HEADACHE & CAN REPEAT IN 2 HRS IF PERSISTENT MAX OF 2/DAY   Current Outpatient Medications (Other):    Calcium Carbonate-Vitamin D (CALCIUM + D PO),     ESTRING 2 MG vaginal ring,    latanoprost (XALATAN) 0.005 % ophthalmic solution, SMARTSIG:In Eye(s)   Multiple Vitamins-Minerals (MULTIVITAMIN,TX-MINERALS) tablet, Take 1 tablet by mouth daily.   Reviewed prior external information including notes and imaging  from  primary care provider As well as notes that were available from care everywhere and other healthcare systems.  Past medical history, social, surgical and family history all reviewed in electronic medical record.  No pertanent information unless stated regarding to the chief complaint.   Review of Systems:  No headache, visual changes, nausea, vomiting, diarrhea, constipation, dizziness, abdominal pain, skin rash, fevers, chills, night sweats, weight loss, swollen lymph nodes, body aches, joint swelling, chest pain, shortness of breath, mood changes. POSITIVE muscle aches  Objective  Blood pressure 102/64, pulse 79, height 5\' 5"  (1.651 m), weight 113 lb (51.3 kg), SpO2 97 %.   General: No apparent distress alert and oriented x3 mood and affect normal, dressed appropriately.  HEENT: Pupils equal, extraocular movements intact  Respiratory: Patient's speak in full sentences and does not appear short of breath  Cardiovascular: No lower extremity edema, non tender, no erythema  Gait normal with good balance and coordination.  MSK: Left shoulder exam still has 5 degrees of external rotation.  He does have some mild decrease in internal rotation but improvement from previous exam.  Flexion to 160 degrees of forward flexion. Limited muscular skeletal ultrasound was performed and interpreted by Hulan Saas, M  Limited ultrasound shows the patient does have still thickening of the anterior capsule noted but rotator cuff appears to be unremarkable. Impression: Continued frozen shoulder   Impression and Recommendations:     The above documentation has been reviewed and is accurate and complete Lyndal Pulley, DO

## 2021-03-24 ENCOUNTER — Ambulatory Visit: Payer: Self-pay

## 2021-03-24 ENCOUNTER — Ambulatory Visit: Payer: Medicare PPO | Admitting: Family Medicine

## 2021-03-24 ENCOUNTER — Other Ambulatory Visit: Payer: Self-pay

## 2021-03-24 ENCOUNTER — Encounter: Payer: Self-pay | Admitting: Family Medicine

## 2021-03-24 VITALS — BP 102/64 | HR 79 | Ht 65.0 in | Wt 113.0 lb

## 2021-03-24 DIAGNOSIS — G8929 Other chronic pain: Secondary | ICD-10-CM

## 2021-03-24 DIAGNOSIS — M25512 Pain in left shoulder: Secondary | ICD-10-CM

## 2021-03-24 NOTE — Assessment & Plan Note (Signed)
Patient still has significant decrease in external rotation of the shoulder noted today.  Patient has improvement in all other planes.  Discussed different treatment options including possible injection which patient declined.  Patient wants to continue with the home exercises and will continue with the range of motion.  Follow-up with me again in 2 to 3 months to make sure patient continues to improve.  Patient also declined advanced imaging.

## 2021-03-24 NOTE — Patient Instructions (Addendum)
Good to see you You are making progress Keep working hard on range of motion  See me again in 2-3 months

## 2021-04-08 ENCOUNTER — Encounter: Payer: Self-pay | Admitting: Family Medicine

## 2021-04-08 DIAGNOSIS — Z1231 Encounter for screening mammogram for malignant neoplasm of breast: Secondary | ICD-10-CM | POA: Diagnosis not present

## 2021-04-22 DIAGNOSIS — H401231 Low-tension glaucoma, bilateral, mild stage: Secondary | ICD-10-CM | POA: Diagnosis not present

## 2021-04-22 DIAGNOSIS — H2513 Age-related nuclear cataract, bilateral: Secondary | ICD-10-CM | POA: Diagnosis not present

## 2021-04-22 DIAGNOSIS — H524 Presbyopia: Secondary | ICD-10-CM | POA: Diagnosis not present

## 2021-04-22 DIAGNOSIS — H04123 Dry eye syndrome of bilateral lacrimal glands: Secondary | ICD-10-CM | POA: Diagnosis not present

## 2021-06-10 NOTE — Progress Notes (Signed)
[place] Phone: [phone] Subjective:    I'm seeing this patient by the request  of:  Marin Olp, MD  CC:   [id number]  03/24/2021 Patient still has significant decrease in external rotation of the shoulder noted today.  Patient has improvement in all other planes.  Discussed different treatment options including possible injection which patient declined.  Patient wants to continue with the home exercises and will continue with the range of motion.  Follow-up with me again in 2 to 3 months to make sure patient continues to improve.  Patient also declined advanced imaging.  Update 06/11/2021 Melinda Suarez is a 66 y.o. female coming in with complaint of L shoulder pain. 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 1 dtr - '96 -[place]. Business. Engineer, maintenance (IT) for industry.       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    Social Determinants of Health   Financial Resource Strain: Not on file  Food Insecurity: Not on file   Transportation Needs: Not on file  Physical Activity: Not on file  Stress: Not on file  Social Connections: Not on file   Allergies  Allergen Reactions   Depo-Medrol [Methylprednisolone] Hives   Elemental Sulfur    Prednisone Hives   Cephalosporins Rash   Penicillins Rash   Family History  Problem Relation Age of Onset   Osteoporosis Mother    Arthritis Mother    Lung cancer Mother        died at 110. still driving and working at church   Kidney disease Father        amylodosis, died in 45s. Tear of aorta and was on blood thinners.    Asthma Brother    Heart disease Paternal Grandfather        42s   Stroke Paternal Grandfather    Diabetes Neg Hx    Hyperlipidemia Neg Hx    Hypertension Neg Hx    Cancer Neg Hx        Current Outpatient Medications (Analgesics):    SUMAtriptan (IMITREX) 100 MG tablet, 1 TABLET FOR MIGRAINE HEADACHE & CAN REPEAT IN 2 HRS IF PERSISTENT MAX OF 2/DAY   Current Outpatient Medications (Other):    Calcium Carbonate-Vitamin D (CALCIUM + D PO),     ESTRING 2 MG vaginal ring,  latanoprost (XALATAN) 0.005 % ophthalmic solution, SMARTSIG:In Eye(s)   Multiple Vitamins-Minerals (MULTIVITAMIN,TX-MINERALS) tablet, Take 1 tablet by mouth daily.   Reviewed prior external information including notes and imaging from  primary care provider As well as notes that were available from care everywhere and other healthcare systems.  Past medical history, social, surgical and family history all reviewed in electronic medical record.  No pertanent information unless stated regarding to the chief complaint.   Review of Systems:  No headache, visual changes, nausea, vomiting, diarrhea, constipation, dizziness, abdominal pain, skin rash, fevers, chills, night sweats, weight loss, swollen lymph nodes, body aches, joint swelling, chest pain, shortness of breath, mood changes. POSITIVE muscle aches  Objective  There were no vitals taken for this visit.    General: No apparent distress alert and oriented x3 mood and affect normal, dressed appropriately.  HEENT: Pupils equal, extraocular movements intact  Respiratory: Patient's speak in full sentences and does not appear short of breath  Cardiovascular: No lower extremity edema, non tender, no erythema  Gait normal with good balance and coordination.  MSK:  Non tender with full range of motion and good stability and symmetric strength and tone of shoulders, elbows, wrist, hip, knee and ankles bilaterally.     Impression and Recommendations:     The above documentation has been reviewed and is accurate and complete Jacqualin Combes

## 2021-06-11 ENCOUNTER — Encounter: Payer: Self-pay | Admitting: Family Medicine

## 2021-06-11 ENCOUNTER — Ambulatory Visit: Payer: Self-pay

## 2021-06-11 ENCOUNTER — Other Ambulatory Visit: Payer: Self-pay

## 2021-06-11 ENCOUNTER — Ambulatory Visit: Payer: Medicare PPO | Admitting: Family Medicine

## 2021-06-11 VITALS — BP 112/80 | HR 65 | Ht 65.0 in | Wt 111.0 lb

## 2021-06-11 DIAGNOSIS — M25512 Pain in left shoulder: Secondary | ICD-10-CM | POA: Diagnosis not present

## 2021-06-11 DIAGNOSIS — G8929 Other chronic pain: Secondary | ICD-10-CM

## 2021-06-11 NOTE — Patient Instructions (Signed)
Looking great Ice at end of a long day Be mindful of the shoulder See you again in 2 months

## 2021-06-11 NOTE — Assessment & Plan Note (Signed)
Left shoulder is making significant progress.  On ultrasound today no significant hypoechoic changes noted.  Patient is increasing activity.  Patient has improved in overall range of motion.  Patient will have a follow-up in 2 to 3 months but if perfect patient can follow-up as needed.

## 2021-06-11 NOTE — Progress Notes (Signed)
[place] Phone: [phone] Subjective:   Melinda Suarez, am serving as a scribe for Dr. Hulan Saas.  This visit occurred during the SARS-CoV-2 public health emergency.  Safety protocols were in place, including screening questions prior to the visit, additional usage of staff PPE, and extensive cleaning of exam room while observing appropriate contact time as indicated for disinfecting solutions.    I'm seeing this patient by the request  of:  Marin Olp, MD  CC: Left shoulder pain follow-up  QMV:[id number]  03/24/2021 Patient still has significant decrease in external rotation of the shoulder noted today.  Patient has improvement in all other planes.  Discussed different treatment options including possible injection which patient declined.  Patient wants to continue with the home exercises and will continue with the range of motion.  Follow-up with me again in 2 to 3 months to make sure patient continues to improve.  Patient also declined advanced imaging.  Update 06/11/2021 Melinda Suarez is a 65 y.o. female coming in with complaint of L shoulder pain. Patient states that she is doing better but has been painting a vanity over the weekend and is experiencing soreness today. Does feel like her pain is less than last visit but still not 100% gone.  Would state overall it is fine to 90% better.       Past Medical History:  Diagnosis Date   Ganglion cyst    Migraine headache    Past Surgical History:  Procedure Laterality Date   BREAST CYST ASPIRATION     benign in 2011   [place]     left hand; left handed   Social History   Socioeconomic History   Marital status: Married    Spouse name: Not on file   Number of children: 1   Years of education: 16   Highest education level: Not on file  Occupational History   Occupation: Engineer, maintenance (IT)    Comment: work for [employer]  Tobacco  Use   Smoking status: Never   Smokeless tobacco: Never  Substance and Sexual Activity   Alcohol use: Yes    Alcohol/week: 7.0 standard drinks    Types: 7 Standard drinks or equivalent per week   Drug use: Suarez   Sexual activity: Yes    Partners: Male  Other Topics Concern   Not on file  Social History Narrative   Married 1980. 1 dtr - '96 -[place]. Business. Engineer, maintenance (IT) for industry.       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 Baking.    Social Determinants of Health   Financial Resource Strain: Not on file  Food Insecurity: Not on file  Transportation Needs: Not on file  Physical Activity: Not on file  Stress: Not on file  Social Connections: Not on file   Allergies  Allergen Reactions   Depo-Medrol [Methylprednisolone] Hives   Elemental Sulfur    Prednisone Hives   Cephalosporins Rash   Penicillins Rash   Family History  Problem Relation Age of Onset   Osteoporosis Mother    Arthritis Mother    Lung cancer Mother        died at 44. still driving and working at church   Kidney disease Father        amylodosis, died in 47s. Tear of aorta and was on blood thinners.    Asthma Brother  Heart disease Paternal Grandfather        87s   Stroke Paternal Grandfather    Diabetes Neg Hx    Hyperlipidemia Neg Hx    Hypertension Neg Hx    Cancer Neg Hx        Current Outpatient Medications (Analgesics):    SUMAtriptan (IMITREX) 100 MG tablet, 1 TABLET FOR MIGRAINE HEADACHE & CAN REPEAT IN 2 HRS IF PERSISTENT MAX OF 2/DAY   Current Outpatient Medications (Other):    Calcium Carbonate-Vitamin D (CALCIUM + D PO),     ESTRING 2 MG vaginal ring,    latanoprost (XALATAN) 0.005 % ophthalmic solution, SMARTSIG:In Eye(s)   Multiple Vitamins-Minerals (MULTIVITAMIN,TX-MINERALS) tablet, Take 1 tablet by mouth daily.   Reviewed prior external information including notes and imaging from  primary care provider As  well as notes that were available from care everywhere and other healthcare systems.  Past medical history, social, surgical and family history all reviewed in electronic medical record.  Suarez pertanent information unless stated regarding to the chief complaint.   Review of Systems:  Suarez headache, visual changes, nausea, vomiting, diarrhea, constipation, dizziness, abdominal pain, skin rash, fevers, chills, night sweats, weight loss, swollen lymph nodes, body aches, joint swelling, chest pain, shortness of breath, mood changes. POSITIVE muscle aches  Objective  Blood pressure 112/80, pulse 65, height 5\' 5"  (1.651 m), weight 111 lb (50.3 kg), SpO2 99 %.   General: Suarez apparent distress alert and oriented x3 mood and affect normal, dressed appropriately.  HEENT: Pupils equal, extraocular movements intact  Respiratory: Patient's speak in full sentences and does not appear short of breath  Cardiovascular: Suarez lower extremity edema, non tender, Suarez erythema  Gait normal with good balance and coordination.  MSK: Left shoulder exam shows the patient has significant improvement in range of motion.  Abnormality noted at this time.  The patient does have just some mild discomfort with empty mild impingement with Neer and Hawkins.  Limited muscular skeletal ultrasound was performed and interpreted by Hulan Saas, M Limited ultrasound of patient's shoulder shows patient does have mild hypoechoic changes of the rotator cuff appears to be unremarkable.    Impression and Recommendations:     The above documentation has been reviewed and is accurate and complete Lyndal Pulley, DO

## 2021-08-13 ENCOUNTER — Ambulatory Visit: Payer: Medicare PPO | Admitting: Family Medicine

## 2021-10-14 DIAGNOSIS — H401232 Low-tension glaucoma, bilateral, moderate stage: Secondary | ICD-10-CM | POA: Diagnosis not present

## 2021-10-14 DIAGNOSIS — H2513 Age-related nuclear cataract, bilateral: Secondary | ICD-10-CM | POA: Diagnosis not present

## 2021-10-15 ENCOUNTER — Encounter: Payer: Medicare PPO | Admitting: Family Medicine

## 2022-01-14 ENCOUNTER — Encounter: Payer: Self-pay | Admitting: Family Medicine

## 2022-01-14 ENCOUNTER — Ambulatory Visit (INDEPENDENT_AMBULATORY_CARE_PROVIDER_SITE_OTHER): Payer: Medicare PPO | Admitting: Family Medicine

## 2022-01-14 VITALS — BP 100/60 | HR 73 | Temp 97.3°F | Ht 65.0 in | Wt 111.6 lb

## 2022-01-14 DIAGNOSIS — M810 Age-related osteoporosis without current pathological fracture: Secondary | ICD-10-CM | POA: Diagnosis not present

## 2022-01-14 DIAGNOSIS — Z1211 Encounter for screening for malignant neoplasm of colon: Secondary | ICD-10-CM | POA: Diagnosis not present

## 2022-01-14 DIAGNOSIS — Z0000 Encounter for general adult medical examination without abnormal findings: Secondary | ICD-10-CM | POA: Diagnosis not present

## 2022-01-14 DIAGNOSIS — E785 Hyperlipidemia, unspecified: Secondary | ICD-10-CM | POA: Diagnosis not present

## 2022-01-14 MED ORDER — SUMATRIPTAN SUCCINATE 100 MG PO TABS: ORAL_TABLET | ORAL | 11 refills | Status: DC

## 2022-01-14 NOTE — Progress Notes (Signed)
?Phone [phone] ?  ?Subjective:  ?Patient presents today for their annual physical. Chief complaint-noted.  ? ?See problem oriented charting- ?ROS- full  review of systems was completed and negative per full ROS sheet completed- does mention continued facial twitch. Maintenance back pain treatment with chiropractor ? ?The following were reviewed and entered/updated in epic: ?Past Medical History:  ?Diagnosis Date  ? Ganglion cyst   ? Migraine headache   ? ?Patient Active Problem List  ? Diagnosis Date Noted  ? Hyperlipidemia 07/18/2014  ?  Priority: Medium   ? Osteoporosis 06/04/2014  ?  Priority: Medium   ? Migraines 06/04/2014  ?  Priority: Medium   ? Caregiver stress 08/20/2016  ?  Priority: Low  ? Glaucoma 06/04/2014  ?  Priority: Low  ? History of disease of skin and subcutaneous tissue 07/02/2007  ?  Priority: Low  ? Left shoulder pain 12/05/2020  ? Other bursal cyst, right hand 04/18/2020  ? ?Past Surgical History:  ?Procedure Laterality Date  ? BREAST CYST ASPIRATION    ? benign in 2011  ? GANGLION CYST EXCISION    ? left hand; left handed  ? ? ?Family History  ?Problem Relation Age of Onset  ? Osteoporosis Mother   ? Arthritis Mother   ? Lung cancer Mother   ?     died at 2. still driving and working at church  ? Kidney disease Father   ?     amylodosis, died in 40s. Tear of aorta and was on blood thinners.   ? Asthma Brother   ? Heart disease Paternal Grandfather   ?     41s  ? Stroke Paternal Grandfather   ? Diabetes Neg Hx   ? Hyperlipidemia Neg Hx   ? Hypertension Neg Hx   ? Cancer Neg Hx   ? ? ?Medications- reviewed and updated ?Current Outpatient Medications  ?Medication Sig Dispense Refill  ? Calcium Carbonate-Vitamin D (CALCIUM + D PO)      ? ESTRING 2 MG vaginal ring     ? latanoprost (XALATAN) 0.005 % ophthalmic solution SMARTSIG:In Eye(s)    ? Multiple Vitamins-Minerals (MULTIVITAMIN,TX-MINERALS) tablet Take 1 tablet by mouth daily.    ? SUMAtriptan (IMITREX) 100 MG tablet 1 TABLET FOR  MIGRAINE HEADACHE & CAN REPEAT IN 2 HRS IF PERSISTENT MAX OF 2/DAY 8 tablet 11  ? ?No current facility-administered medications for this visit.  ? ? ?Allergies-reviewed and updated ?Allergies  ?Allergen Reactions  ? Depo-Medrol [Methylprednisolone] Hives  ? Elemental Sulfur   ? Prednisone Hives  ? Cephalosporins Rash  ? Penicillins Rash  ? ? ?Social History  ? ?Social History Narrative  ? Married 1980. 1 dtr - '96 -[place]  ?   ? [employer]. Business. Engineer, maintenance (IT) for industry.   ?   ? 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   ? ?Objective  ?Objective:  ?BP 100/60   Pulse 73   Temp (!) 97.3 ?F (36.3 ?C)   Ht '5\' 5"'$  (1.651 m)   Wt 111 lb 9.6 oz (50.6 kg)   SpO2 98%   BMI 18.57 kg/m?  ?Gen: NAD, resting comfortably ?HEENT: Mucous membranes are moist. Oropharynx normal ?Neck: no thyromegaly ?CV: RRR no murmurs rubs or gallops ?Lungs: CTAB no crackles, wheeze, rhonchi ?Abdomen: soft/nontender/nondistended/normal bowel sounds. No rebound or guarding.  ?Ext: no edema ?Skin: warm, dry ?Neuro: grossly normal, moves all extremities, PERRLA ?  ?Assessment and Plan  ? ?66 y.o. female presenting for  annual physical.  ?Health Maintenance counseling: ?1. Anticipatory guidance: Patient counseled regarding regular dental exams -q6 months, eye exams-every 6 months due to glaucoma,  avoiding smoking and second hand smoke , limiting alcohol to 1 beverage per day- 7 beverages per week , no illicit drugs .   ?2. Risk factor reduction:  Advised patient of need for regular exercise and diet rich and fruits and vegetables to reduce risk of heart attack and stroke.  ?Exercise- mainly walking- 15 month fitness challenge- won with her team.  ?Diet/weight management-weight overall stable- prefer no further weight loss ?- healthy eating challenge this year.  ?Wt Readings from Last 3 Encounters:  ?01/14/22 111 lb 9.6 oz (50.6 kg)  ?06/11/21 111 lb (50.3 kg)  ?03/24/21 113 lb (51.3 kg)  ?3.  Immunizations/screenings/ancillary studies-has had 1 Shingrix on file-she reportshad 2nd and will send [place] the date. Also had covid on thanksgiving  ?Immunization History  ?Administered Date(s) Administered  ? Influenza,inj,Quad PF,6+ Mos 06/13/2015, 09/21/2017, 06/20/2019  ? Influenza-Unspecified 06/07/2013, 06/30/2016, 08/02/2018, 05/31/2020  ? PFIZER(Purple Top)SARS-COV-2 Vaccination 11/20/2019, 12/11/2019, 08/01/2020  ? Pension scheme manager 58yr & up 09/12/2021  ? Td 09/01/2001  ? Tdap 06/02/2012  ? Zoster Recombinat (Shingrix) 12/01/2020  ?4. Cervical cancer screening- past age based screeningr ecommendations for Pap smears.  Last on file 08/06/2017 HPV negative-last year she reported updated Pap that was reportedly normal   ?5. Breast cancer screening-  breast exam with Dr. FValentino Saxonand mammogram -04/08/2021 ?6. Colon cancer screening - Cologuard last completed 10/17/2018-we sent an updated Cologuard order ?7. Skin cancer screening- not seeing derm but consideringadvised regular sunscreen use. Denies worrisome, changing, or new skin lesions.  ?8. Birth control/STD check- postmenopausal and monogamous ?9. Osteoporosis screening at 89 see discussion below ?10. Smoking associated screening - Never smoker ? ?Status of chronic or acute concerns  ? ?#Left shoulder pain-was working with Dr. STamala Julianlast yWarrenvisit 06/11/2021- much better after PT  ? ?#Facial twitch on left- stable- head CT 04/22/20- not worsening. Wants to hold off on neurology consult ? ?# Osteoporosis ?S: Last DEXA: 10/07/2020 with GYN-significantly significant increase in bone density in 2 areas and stable and others ? ?Medication (bisphosphonate or prolia): Not currently on bisphosphonate- think sstopped around 2018 ? ?Calcium: '1200mg'$  (through diet ok) recommended -patient takes Caltrate ?Vitamin D: 1000 units a day recommended-patient takes extra vitamin D-levels have been excellent  ?A/P: improvement - history of bisphosphonate  stopped over 5 years ago we think- we will consider repeat 2 years ? ?#hyperlipidemia ?S: Medication:None. ?-10-year ASCVD risk has been around 3% typically ?-mom heart attack but at age 66?Lab Results  ?Component Value Date  ? CHOL 216 (H) 10/14/2020  ? HDL 93.30 10/14/2020  ? LDLCALC 109 (H) 10/14/2020  ? LDLDIRECT 95.7 12/16/2010  ? TRIG 71.0 10/14/2020  ? CHOLHDL 2 10/14/2020  ? A/P: mild elevations- update lipid panel ? ?#Migraines ?S: Patient continues to take approximately 10 Imitrex per year- stable ?-No changes in migraine pattern  ?A/P: overall stable- continue current meds  ? ?% Glaucoma-continue twice yearly follow-up with ophthalmology ?As above ? ?Recommended follow up: Return in about 1 year (around 01/15/2023) for physical or sooner if needed.Schedule b4 you leave. ? ?Lab/Order associations:salad from [employer] grilled chicken avocado ?  ICD-10-CM   ?1. Screening for colon cancer  Z12.11   ?  ?2. Preventative health care  Z00.00   ?  ?3. Hyperlipidemia, unspecified hyperlipidemia type  E78.5   ?  ?4.  Age-related osteoporosis without current pathological fracture  M81.0   ?  ? ? ?No orders of the defined types were placed in this encounter. ? ? ?Return precautions advised.  ?Garret Reddish, MD ? ? ?

## 2022-01-14 NOTE — Patient Instructions (Addendum)
Let us know when you got your second [facility] vaccine. ? ?Let us know if you do not receive cologuard within 2-3 weeks ? ?Please stop by lab before you go ?If you have mychart- we will send your results within 3 business days of [place] receiving them.  ?If you do not have mychart- we will call you about results within 5 business days of [place] receiving them.  ?*please also note that you will see labs on mychart as soon as they post. I will later go in and write notes on them- will say "notes from Dr. Yong Channel"  ? ?Recommended follow up: Return in about 1 year (around 01/15/2023) for physical or sooner if needed.Schedule b4 you leave.  ?

## 2022-01-15 LAB — CBC WITH DIFFERENTIAL/PLATELET
Basophils Absolute: 0.1 10*3/uL (ref 0.0–0.1)
Basophils Relative: 1.2 % (ref 0.0–3.0)
Eosinophils Absolute: 0.3 10*3/uL (ref 0.0–0.7)
Eosinophils Relative: 6.5 % — ABNORMAL HIGH (ref 0.0–5.0)
HCT: 36.9 % (ref 36.0–46.0)
Hemoglobin: 12.4 g/dL (ref 12.0–15.0)
Lymphocytes Relative: 32.6 % (ref 12.0–46.0)
Lymphs Abs: 1.6 10*3/uL (ref 0.7–4.0)
MCHC: 33.7 g/dL (ref 30.0–36.0)
MCV: 95.4 fl (ref 78.0–100.0)
Monocytes Absolute: 0.5 10*3/uL (ref 0.1–1.0)
Monocytes Relative: 10.9 % (ref 3.0–12.0)
Neutro Abs: 2.3 10*3/uL (ref 1.4–7.7)
Neutrophils Relative %: 48.8 % (ref 43.0–77.0)
Platelets: 309 10*3/uL (ref 150.0–400.0)
RBC: 3.86 Mil/uL — ABNORMAL LOW (ref 3.87–5.11)
RDW: 14.4 % (ref 11.5–15.5)
WBC: 4.8 10*3/uL (ref 4.0–10.5)

## 2022-01-15 LAB — COMPREHENSIVE METABOLIC PANEL
ALT: 21 U/L (ref 0–35)
AST: 28 U/L (ref 0–37)
Albumin: 4.4 g/dL (ref 3.5–5.2)
Alkaline Phosphatase: 61 U/L (ref 39–117)
BUN: 23 mg/dL (ref 6–23)
CO2: 31 mEq/L (ref 19–32)
Calcium: 9.5 mg/dL (ref 8.4–10.5)
Chloride: 99 mEq/L (ref 96–112)
Creatinine, Ser: 0.78 mg/dL (ref 0.40–1.20)
GFR: 79.26 mL/min (ref >60.00)
Glucose, Bld: 83 mg/dL (ref 70–99)
Potassium: 4.3 mEq/L (ref 3.5–5.1)
Sodium: 137 mEq/L (ref 135–145)
Total Bilirubin: 0.5 mg/dL (ref 0.2–1.2)
Total Protein: 7.3 g/dL (ref 6.0–8.3)

## 2022-01-15 LAB — LIPID PANEL
Cholesterol: 202 mg/dL — ABNORMAL HIGH (ref 0–200)
HDL: 89.9 mg/dL (ref >39.00)
LDL Cholesterol: 97 mg/dL (ref 0–99)
NonHDL: 112.29
Total CHOL/HDL Ratio: 2
Triglycerides: 75 mg/dL (ref 0.0–149.0)
VLDL: 15 mg/dL (ref 0.0–40.0)

## 2022-01-16 ENCOUNTER — Ambulatory Visit (INDEPENDENT_AMBULATORY_CARE_PROVIDER_SITE_OTHER): Payer: Medicare PPO

## 2022-01-16 DIAGNOSIS — Z0000 Encounter for general adult medical examination without abnormal findings: Secondary | ICD-10-CM | POA: Diagnosis not present

## 2022-01-16 NOTE — Progress Notes (Addendum)
Virtual Visit via Telephone Note  I connected with  Melinda Suarez on 01/16/22 at 11:30 AM EDT by telephone and verified that I am speaking with the correct person using two identifiers.  Medicare Annual Wellness visit completed telephonically due to Covid-19 pandemic.   Persons participating in this call: This Health Coach and this patient.   Location: Patient: home Provider: office   I discussed the limitations, risks, security and privacy concerns of performing an evaluation and management service by telephone and the availability of in person appointments. The patient expressed understanding and agreed to proceed.  Unable to perform video visit due to video visit attempted and failed and/or patient does not have video capability.   Some vital signs may be absent or patient reported.   Willette Brace, LPN   Subjective:   Melinda Suarez is a 66 y.o. female who presents for an Initial Medicare Annual Wellness Visit.  Review of Systems     Cardiac Risk Factors include: advanced age (>59mn, >>85women);dyslipidemia     Objective:    There were no vitals filed for this visit. There is no height or weight on file to calculate BMI.     01/16/2022   11:26 AM  Advanced Directives  Does Patient Have a Medical Advance Directive? Yes  Type of Advance Directive HFl[place]in Chart? No - copy requested    Current Medications (verified) Outpatient Encounter Medications as of 01/16/2022  Medication Sig   Calcium Carbonate-Vitamin D (CALCIUM + D PO)     ESTRING 2 MG vaginal ring    latanoprost (XALATAN) 0.005 % ophthalmic solution SMARTSIG:In Eye(s)   Multiple Vitamins-Minerals (MULTIVITAMIN,TX-MINERALS) tablet Take 1 tablet by mouth daily.   SUMAtriptan (IMITREX) 100 MG tablet 1 TABLET FOR MIGRAINE HEADACHE & CAN REPEAT IN 2 HRS IF PERSISTENT MAX OF 2/DAY   No facility-administered encounter medications on file as of  01/16/2022.    Allergies (verified) Depo-medrol [methylprednisolone], Elemental sulfur, Prednisone, Cephalosporins, and Penicillins   History: Past Medical History:  Diagnosis Date   Ganglion cyst    Migraine headache    Past Surgical History:  Procedure Laterality Date   BREAST CYST ASPIRATION     benign in 2011   [place]    left hand; left handed   Family History  Problem Relation Age of Onset   Osteoporosis Mother    Arthritis Mother    Lung cancer Mother        died at 955 still driving and working at church   Kidney disease Father        amylodosis, died in 629s Tear of aorta and was on blood thinners.    Asthma Brother    Heart disease Paternal Grandfather        775s  Stroke Paternal Grandfather    Diabetes Neg Hx    Hyperlipidemia Neg Hx    Hypertension Neg Hx    Cancer Neg Hx    Social History   Socioeconomic History   Marital status: Married    Spouse name: Not on file   Number of children: 1   Years of education: 16   Highest education level: Not on file  Occupational History   Occupation: CEngineer, maintenance (IT)   Comment: work for [employer] photographer[employer]  Tobacco Use   Smoking status: Never   Smokeless tobacco: Never  Substance and Sexual Activity   Alcohol use: Yes    Alcohol/week: 7.0  standard drinks    Types: 7 Standard drinks or equivalent per week   Drug use: No   Sexual activity: Yes    Partners: Male  Other Topics Concern   Not on file  Social History Narrative   Married 1980. 1 dtr - '96 -[place]. Business. Engineer, maintenance (IT) for industry.       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 Baking.    Social Determinants of Health   Financial Resource Strain: Low Risk    Difficulty of Paying Living Expenses: Not hard at all  Food Insecurity: No Food Insecurity   Worried About Charity fundraiser in the Last Year: Never true   [place] in the Last Year: Never true  Transportation  Needs: No Transportation Needs   Lack of Transportation (Medical): No   Lack of Transportation (Non-Medical): No  Physical Activity: Sufficiently Active   Days of Exercise per Week: 5 days   Minutes of Exercise per Session: 30 min  Stress: No Stress Concern Present   Feeling of Stress : Not at all  Social Connections: Socially Integrated   Frequency of Communication with Friends and Family: More than three times a week   Frequency of Social Gatherings with Friends and Family: More than three times a week   Attends Religious Services: More than 4 times per year   Active Member of [employer] or Organizations: Yes   Attends Archivist Meetings: 1 to 4 times per year   Marital Status: Married    Tobacco Counseling Counseling given: Not Answered   Clinical Intake:  Pre-visit preparation completed: Yes  Pain : No/denies pain     BMI - recorded: 18.47 Nutritional Status: BMI <19  Underweight Nutritional Risks: None Diabetes: No  How often do you need to have someone help you when you read instructions, pamphlets, or other written materials from your doctor or pharmacy?: 1 - Never  Diabetic?no  Interpreter Needed?: No  Information entered by :: Charlott Rakes, LPN   Activities of Daily Living    01/16/2022   11:28 AM  In your present state of health, do you have any difficulty performing the following activities:  Hearing? 0  Vision? 0  Difficulty concentrating or making decisions? 0  Walking or climbing stairs? 0  Dressing or bathing? 0  Doing errands, shopping? 0  Preparing Food and eating ? N  Using the Toilet? N  In the past six months, have you accidently leaked urine? N  Do you have problems with loss of bowel control? N  Managing your Medications? N  Managing your Finances? N  Housekeeping or managing your Housekeeping? N    Patient Care Team: Marin Olp, MD as PCP - General (Family Medicine)  Indicate any recent Medical Services you may  have received from other than [facility] providers in the past year (date may be approximate).     Assessment:   This is a routine wellness examination for Melinda Suarez.  Hearing/Vision screen Hearing Screening - Comments:: Pt denies any hearing issues  Vision Screening - Comments:: Pt follows up With Dr Satira Sark for annual eye exams   Dietary issues and exercise activities discussed: Current Exercise Habits: Home exercise routine, Type of exercise: Other - see comments, Time (Minutes): 30, Frequency (Times/Week): 5, Weekly Exercise (Minutes/Week): 150   Goals Addressed             This Visit's Progress  Patient Stated       Eat right and exercise more        Depression Screen    01/16/2022   11:25 AM 01/14/2022    2:49 PM 10/14/2020    8:13 AM 10/05/2019    7:58 AM 10/03/2018    2:55 PM 09/21/2017    3:35 PM  PHQ 2/9 Scores  PHQ - 2 Score 0 0 0 0 1 0  PHQ- 9 Score  0  '1 2 2    '$ Fall Risk    01/16/2022   11:27 AM 01/14/2022    2:50 PM 10/14/2020    8:13 AM 03/11/2020    2:33 PM 10/05/2019    7:59 AM  Fall Risk   Falls in the past year? 0 0 0 0 0  Number falls in past yr: 0 0 0 0 0  Injury with Fall? 0 0 0 0 0  Risk for fall due to : Impaired vision No Fall Risks     Follow up Falls prevention discussed Falls evaluation completed       FALL RISK PREVENTION PERTAINING TO THE HOME:  Any stairs in or around the home? Yes  If so, are there any without handrails? No  Home free of loose throw rugs in walkways, pet beds, electrical cords, etc? Yes  Adequate lighting in your home to reduce risk of falls? Yes   ASSISTIVE DEVICES UTILIZED TO PREVENT FALLS:  Life alert? No  Use of a cane, walker or w/c? No  Grab bars in the bathroom? No  Shower chair or bench in shower? No  Elevated toilet seat or a handicapped toilet? No   TIMED UP AND GO:  Was the test performed? No .   Cognitive Function:        01/16/2022   11:28 AM  6CIT Screen  What Year? 0 points  What month? 0 points   What time? 0 points  Count back from 20 0 points  Months in reverse 0 points  Repeat phrase 0 points  Total Score 0 points    Immunizations Immunization History  Administered Date(s) Administered   Influenza,inj,Quad PF,6+ Mos 06/13/2015, 09/21/2017, 06/20/2019   Influenza-Unspecified 06/07/2013, 06/30/2016, 08/02/2018, 05/31/2020, 06/28/2021   PFIZER(Purple Top)SARS-COV-2 Vaccination 11/20/2019, 12/11/2019, 08/01/2020   Pfizer Covid-19 Vaccine Bivalent Booster 66yr & up 09/12/2021   Td 09/01/2001   Tdap 06/02/2012   Zoster Recombinat (Shingrix) 12/01/2020    TDAP status: Up to date  Flu Vaccine status: Up to date  Pneumococcal vaccine status: Due, Education has been provided regarding the importance of this vaccine. Advised may receive this vaccine at local pharmacy or Health Dept. Aware to provide a copy of the vaccination record if obtained from local pharmacy or Health Dept. Verbalized acceptance and understanding.  Covid-19 vaccine status: Completed vaccines  Qualifies for Shingles Vaccine? Yes   Zostavax completed Yes   Shingrix Completed?: Yes  Screening Tests Health Maintenance  Topic Date Due   Zoster Vaccines- Shingrix (2 of 2) 01/26/2021   Fecal DNA (Cologuard)  10/17/2021   Pneumonia Vaccine 66 Years old (1 - PCV) 01/15/2023 (Originally 10/28/2020)   Hepatitis C Screening  08/27/2098 (Originally 10/28/1973)   INFLUENZA VACCINE  03/31/2022   TETANUS/TDAP  06/02/2022   MAMMOGRAM  04/09/2023   DEXA SCAN  10/08/2023   COVID-19 Vaccine  Completed   HPV VACCINES  Aged Out   COLONOSCOPY (Pts 45-456yrInsurance coverage will need to be confirmed)  Discontinued    Health Maintenance  Health Maintenance Due  Topic Date Due   Zoster Vaccines- Shingrix (2 of 2) 01/26/2021   Fecal DNA (Cologuard)  10/17/2021    Colorectal cancer screening: Referral to GI placed 01/16/22. Pt aware the office will call re: appt.  Mammogram status: Completed 04/08/21. Repeat every  year  Bone Density status: Completed 10/07/20. Results reflect: Bone density results: OSTEOPOROSIS. Repeat every 3 years.   Additional Screening:  Hepatitis C Screening: postponed   Vision Screening: Recommended annual ophthalmology exams for early detection of glaucoma and other disorders of the eye. Is the patient up to date with their annual eye exam?  Yes  Who is the provider or what is the name of the office in which the patient attends annual eye exams? Dr Satira Sark  If pt is not established with a provider, would they like to be referred to a provider to establish care? No .   Dental Screening: Recommended annual dental exams for proper oral hygiene  Community Resource Referral / Chronic Care Management: CRR required this visit?  No   CCM required this visit?  No      Plan:     I have personally reviewed and noted the following in the patient's chart:   Medical and social history Use of alcohol, tobacco or illicit drugs  Current medications and supplements including opioid prescriptions. Patient is not currently taking opioid prescriptions. Functional ability and status Nutritional status Physical activity Advanced directives List of other physicians Hospitalizations, surgeries, and ER visits in previous 12 months Vitals Screenings to include cognitive, depression, and falls Referrals and appointments  In addition, I have reviewed and discussed with patient certain preventive protocols, quality metrics, and best practice recommendations. A written personalized care plan for preventive services as well as general preventive health recommendations were provided to patient.     Willette Brace, LPN   [id number]   Nurse Notes: None

## 2022-01-16 NOTE — Patient Instructions (Signed)
Melinda Suarez , Thank you for taking time to come for your Medicare Wellness Visit. I appreciate your ongoing commitment to your health goals. Please review the following plan we discussed and let me know if I can assist you in the future.   Screening recommendations/referrals: Colonoscopy: cologuard order pending repeat every 3 years after  Mammogram: done 04/08/21 repeat every year  Bone Density: Done 12/12/20 repeat every 3 years  Recommended yearly ophthalmology/optometry visit for glaucoma screening and checkup Recommended yearly dental visit for hygiene and checkup  Vaccinations: Influenza vaccine: Done 06/28/21 repeat every year  Pneumococcal vaccine: postponed  Tdap vaccine: Done 06/02/12 repeat every 10 years due 06/02/22  Shingles vaccine: 1st dose 12/01/20   Covid-19:Completed 3/22, 4/12, & 08/01/20  Advanced directives: Please bring a copy of your health care power of attorney and living will to the office at your convenience.  Conditions/risks identified: eat right and exercise more   Next appointment: Follow up in one year for your annual wellness visit    Preventive Care 66 Years and Older, Female Preventive care refers to lifestyle choices and visits with your health care provider that can promote health and wellness. What does preventive care include? A yearly physical exam. This is also called an annual well check. Dental exams once or twice a year. Routine eye exams. Ask your health care provider how often you should have your eyes checked. Personal lifestyle choices, including: Daily care of your teeth and gums. Regular physical activity. Eating a healthy diet. Avoiding tobacco and drug use. Limiting alcohol use. Practicing safe sex. Taking low-dose aspirin every day. Taking vitamin and mineral supplements as recommended by your health care provider. What happens during an annual well check? The services and screenings done by your health care provider during your  annual well check will depend on your age, overall health, lifestyle risk factors, and family history of disease. Counseling  Your health care provider may ask you questions about your: Alcohol use. Tobacco use. Drug use. Emotional well-being. Home and relationship well-being. Sexual activity. Eating habits. History of falls. Memory and ability to understand (cognition). Work and work Statistician. Reproductive health. Screening  You may have the following tests or measurements: Height, weight, and BMI. Blood pressure. Lipid and cholesterol levels. These may be checked every 5 years, or more frequently if you are over 62 years old. Skin check. Lung cancer screening. You may have this screening every year starting at age 75 if you have a 30-pack-year history of smoking and currently smoke or have quit within the past 15 years. Fecal occult blood test (FOBT) of the stool. You may have this test every year starting at age 17. Flexible sigmoidoscopy or colonoscopy. You may have a sigmoidoscopy every 5 years or a colonoscopy every 10 years starting at age 67. Hepatitis C blood test. Hepatitis B blood test. Sexually transmitted disease (STD) testing. Diabetes screening. This is done by checking your blood sugar (glucose) after you have not eaten for a while (fasting). You may have this done every 1-3 years. Bone density scan. This is done to screen for osteoporosis. You may have this done starting at age 93. Mammogram. This may be done every 1-2 years. Talk to your health care provider about how often you should have regular mammograms. Talk with your health care provider about your test results, treatment options, and if necessary, the need for more tests. Vaccines  Your health care provider may recommend certain vaccines, such as: Influenza vaccine. This is recommended  every year. Tetanus, diphtheria, and acellular pertussis (Tdap, Td) vaccine. You may need a Td booster every 10  years. Zoster vaccine. You may need this after age 108. Pneumococcal 13-valent conjugate (PCV13) vaccine. One dose is recommended after age 66. Pneumococcal polysaccharide (PPSV23) vaccine. One dose is recommended after age 40. Talk to your health care provider about which screenings and vaccines you need and how often you need them. This information is not intended to replace advice given to you by your health care provider. Make sure you discuss any questions you have with your health care provider. Document Released: 09/13/2015 Document Revised: 05/06/2016 Document Reviewed: 06/18/2015 Elsevier Interactive Patient Education  2017 [employer] Prevention in the Home Falls can cause injuries. They can happen to people of all ages. There are many things you can do to make your home safe and to help prevent falls. What can I do on the outside of my home? Regularly fix the edges of walkways and driveways and fix any cracks. Remove anything that might make you trip as you walk through a door, such as a raised step or threshold. Trim any bushes or trees on the path to your home. Use bright outdoor lighting. Clear any walking paths of anything that might make someone trip, such as rocks or tools. Regularly check to see if handrails are loose or broken. Make sure that both sides of any steps have handrails. Any raised decks and porches should have guardrails on the edges. Have any leaves, snow, or ice cleared regularly. Use sand or salt on walking paths during winter. Clean up any spills in your garage right away. This includes oil or grease spills. What can I do in the bathroom? Use night lights. Install grab bars by the toilet and in the tub and shower. Do not use towel bars as grab bars. Use non-skid mats or decals in the tub or shower. If you need to sit down in the shower, use a plastic, non-slip stool. Keep the floor dry. Clean up any water that spills on the floor as soon as it  happens. Remove soap buildup in the tub or shower regularly. Attach bath mats securely with double-sided non-slip rug tape. Do not have throw rugs and other things on the floor that can make you trip. What can I do in the bedroom? Use night lights. Make sure that you have a light by your bed that is easy to reach. Do not use any sheets or blankets that are too big for your bed. They should not hang down onto the floor. Have a firm chair that has side arms. You can use this for support while you get dressed. Do not have throw rugs and other things on the floor that can make you trip. What can I do in the kitchen? Clean up any spills right away. Avoid walking on wet floors. Keep items that you use a lot in easy-to-reach places. If you need to reach something above you, use a strong step stool that has a grab bar. Keep electrical cords out of the way. Do not use floor polish or wax that makes floors slippery. If you must use wax, use non-skid floor wax. Do not have throw rugs and other things on the floor that can make you trip. What can I do with my stairs? Do not leave any items on the stairs. Make sure that there are handrails on both sides of the stairs and use them. Fix handrails that are broken  or loose. Make sure that handrails are as long as the stairways. Check any carpeting to make sure that it is firmly attached to the stairs. Fix any carpet that is loose or worn. Avoid having throw rugs at the top or bottom of the stairs. If you do have throw rugs, attach them to the floor with carpet tape. Make sure that you have a light switch at the top of the stairs and the bottom of the stairs. If you do not have them, ask someone to add them for you. What else can I do to help prevent falls? Wear shoes that: Do not have high heels. Have rubber bottoms. Are comfortable and fit you well. Are closed at the toe. Do not wear sandals. If you use a stepladder: Make sure that it is fully opened.  Do not climb a closed stepladder. Make sure that both sides of the stepladder are locked into place. Ask someone to hold it for you, if possible. Clearly mark and make sure that you can see: Any grab bars or handrails. First and last steps. Where the edge of each step is. Use tools that help you move around (mobility aids) if they are needed. These include: Canes. Walkers. Scooters. Crutches. Turn on the lights when you go into a dark area. Replace any light bulbs as soon as they burn out. Set up your furniture so you have a clear path. Avoid moving your furniture around. If any of your floors are uneven, fix them. If there are any pets around you, be aware of where they are. Review your medicines with your doctor. Some medicines can make you feel dizzy. This can increase your chance of falling. Ask your doctor what other things that you can do to help prevent falls. This information is not intended to replace advice given to you by your health care provider. Make sure you discuss any questions you have with your health care provider. Document Released: 06/13/2009 Document Revised: 01/23/2016 Document Reviewed: 09/21/2014 Elsevier Interactive Patient Education  2017 [employer].

## 2022-01-24 DIAGNOSIS — Z1211 Encounter for screening for malignant neoplasm of colon: Secondary | ICD-10-CM | POA: Diagnosis not present

## 2022-02-02 LAB — COLOGUARD: COLOGUARD: NEGATIVE

## 2022-04-08 DIAGNOSIS — H43813 Vitreous degeneration, bilateral: Secondary | ICD-10-CM | POA: Diagnosis not present

## 2022-04-08 DIAGNOSIS — H524 Presbyopia: Secondary | ICD-10-CM | POA: Diagnosis not present

## 2022-04-08 DIAGNOSIS — H401232 Low-tension glaucoma, bilateral, moderate stage: Secondary | ICD-10-CM | POA: Diagnosis not present

## 2022-04-08 DIAGNOSIS — H2513 Age-related nuclear cataract, bilateral: Secondary | ICD-10-CM | POA: Diagnosis not present

## 2022-04-14 DIAGNOSIS — Z1231 Encounter for screening mammogram for malignant neoplasm of breast: Secondary | ICD-10-CM | POA: Diagnosis not present

## 2022-04-22 DIAGNOSIS — S63591A Other specified sprain of right wrist, initial encounter: Secondary | ICD-10-CM | POA: Diagnosis not present

## 2022-04-22 DIAGNOSIS — S63592A Other specified sprain of left wrist, initial encounter: Secondary | ICD-10-CM | POA: Diagnosis not present

## 2022-04-23 DIAGNOSIS — H25811 Combined forms of age-related cataract, right eye: Secondary | ICD-10-CM | POA: Diagnosis not present

## 2022-04-23 DIAGNOSIS — H269 Unspecified cataract: Secondary | ICD-10-CM | POA: Diagnosis not present

## 2022-04-23 DIAGNOSIS — H2511 Age-related nuclear cataract, right eye: Secondary | ICD-10-CM | POA: Diagnosis not present

## 2022-04-23 DIAGNOSIS — H25011 Cortical age-related cataract, right eye: Secondary | ICD-10-CM | POA: Diagnosis not present

## 2022-04-29 ENCOUNTER — Other Ambulatory Visit: Payer: Self-pay

## 2022-04-29 ENCOUNTER — Encounter: Payer: Self-pay | Admitting: Physical Therapy

## 2022-04-29 ENCOUNTER — Ambulatory Visit: Payer: Medicare PPO | Attending: Nurse Practitioner | Admitting: Physical Therapy

## 2022-04-29 DIAGNOSIS — M6281 Muscle weakness (generalized): Secondary | ICD-10-CM | POA: Diagnosis not present

## 2022-04-29 DIAGNOSIS — M25532 Pain in left wrist: Secondary | ICD-10-CM | POA: Insufficient documentation

## 2022-04-29 DIAGNOSIS — M25531 Pain in right wrist: Secondary | ICD-10-CM | POA: Insufficient documentation

## 2022-04-29 NOTE — Therapy (Signed)
OUTPATIENT PHYSICAL THERAPY WRIST EVALUATION   Patient Name: Melinda Suarez MRN: [id number] DOB:1956-07-19, 66 y.o., female Today's Date: 04/29/2022    Past Medical History:  Diagnosis Date   Ganglion cyst    Migraine headache    Past Surgical History:  Procedure Laterality Date   BREAST CYST ASPIRATION     benign in 2011   [place]     left hand; left handed   Patient Active Problem List   Diagnosis Date Noted   Left shoulder pain 12/05/2020   Other bursal cyst, right hand 04/18/2020   Caregiver stress 08/20/2016   Hyperlipidemia 07/18/2014   Osteoporosis 06/04/2014   Migraines 06/04/2014   Glaucoma 06/04/2014   History of disease of skin and subcutaneous tissue 07/02/2007      REFERRING PROVIDER: Bo Merino I, NP   REFERRING DIAG: [phone] (ICD-10-CM) - Other specified sprain of right wrist, initial encounter S63.592A (ICD-10-CM) - Other specified sprain of left wrist, initial encounter   THERAPY DIAG:  No diagnosis found.  Rationale for Evaluation and Treatment Rehabilitation  ONSET DATE: 1 week ago  SUBJECTIVE:                                                                                                                                                                                      SUBJECTIVE STATEMENT: Pt states she fell doing yoga, falling backwards and landing on bil outstretched hands as she went to stand from sitting in cross legged on mat.  Went to urgent care same day and xrays were negative.  Diagnosed with bil wrist sprains.  Pt wearing bil wrist braces full time for at least 2 weeks.  Pt is icing them 4x/day.  Pt feels the swelling and pain is incrementally improving.     Sees Dr. Charlann Boxer on 05/11/22.   PERTINENT HISTORY: Ganglion cyst on Lt, migraine headaches, osteopenia  PAIN:  PAIN:  Are you having pain? Yes NPRS scale: 3-4/10 Pain location: both wrists Pain orientation: Right and Left  PAIN TYPE: aching and  sharp Pain description: intermittent  Aggravating factors: picking things up even with braces on Relieving factors: rest, wearing braces   PRECAUTIONS: Other: osteopenia  WEIGHT BEARING RESTRICTIONS No  FALLS:  Has patient fallen in last 6 months? Yes. Number of falls 1  LIVING ENVIRONMENT: Lives with: lives with their family Lives in: House/apartment Stairs: 2 level home Has following equipment at home: None  OCCUPATION: Full time accountant  PLOF: Independent  PATIENT GOALS return to lifting dumbbells, carry groceries, turn doorknobs, carry work bag into work  OBJECTIVE:   DIAGNOSTIC FINDINGS:  Xrays bil wrists negative for fractures at urgent care  PATIENT SURVEYS:  FOTO 35%, goal 63%  COGNITION:  Overall cognitive status: Within functional limits for tasks assessed     SENSATION: WFL  POSTURE: WNL  UPPER EXTREMITY ROM:   Active ROM Right eval Left eval  Shoulder flexion    Shoulder extension    Shoulder abduction    Shoulder adduction    Shoulder internal rotation    Shoulder external rotation    Elbow flexion    Elbow extension    Wrist flexion 50% 50%  Wrist extension 40% 30%  Wrist ulnar deviation 50% 50%  Wrist radial deviation 50% 50%  Wrist pronation    Wrist supination    (Blank rows = not tested)  UPPER EXTREMITY MMT: Not formally assessed, weak bil grip strength    JOINT MOBILITY TESTING:  N/a  PALPATION:  Exquisite tenderness distal radius bil Significant bruising without edema bil anterior forearms at and above wrist   TODAY'S TREATMENT:  Discussion of evaluation findings Recommend seeing orthopedist before proceeding with any treatment plan   PATIENT EDUCATION: Education details: recommend see ortho MD first before treatment plan Person educated: Patient Education method: Explanation Education comprehension: verbalized understanding   HOME EXERCISE PROGRAM: Deferred until sees ortho MD  ASSESSMENT:  CLINICAL  IMPRESSION: Patient is a 66 y.o. female who was seen today for physical therapy evaluation and treatment for bil wrist pain secondary to falling backwards 8 days ago, landing on outstretched hands.  Same day xrays were negative for fracture.  Pt in soft velcro wrist braces since then and using ice 4x/day.  Pt with limited ROM in all planes, weak grip, signif bruising and exquisite tenderness along distal radius bil.  PT recommends see ortho MD before proceeding with treatment plan.  Further goals to be written as Pt returns following MD visit.    OBJECTIVE IMPAIRMENTS decreased activity tolerance, decreased mobility, decreased ROM, decreased strength, impaired UE functional use, and pain.   ACTIVITY LIMITATIONS carrying, lifting, bathing, toileting, dressing, and hygiene/grooming  PARTICIPATION LIMITATIONS: meal prep, cleaning, laundry, driving, community activity, occupation, and yard work  PERSONAL FACTORS 1 comorbidity: osteopenia  are also affecting patient's functional outcome.   REHAB POTENTIAL: Good  CLINICAL DECISION MAKING: Stable/uncomplicated  EVALUATION COMPLEXITY: Low   GOALS: Goals reviewed with patient? Yes  SHORT TERM GOALS: Target date: 05/27/22 (Remove Blue Hyperlink)  Pt will see orthopedist before returning to clinic Baseline: Goal status: INITIAL  2.   Baseline:  Goal status:   3.   Baseline:  Goal status:   4.   Baseline:  Goal status:   5.   Baseline:  Goal status:   6.   Baseline:  Goal status:   LONG TERM GOALS: Target date: 06/24/22 (Remove Blue Hyperlink)  Pt will be ind with advanced HEP to maximize return of function of bil hands/UE. Baseline:  Goal status: INITIAL  2.  Pt will score at least 63% on FOTO to demo improved functional use of hands.  Baseline: 35% Goal status: INITIAL  3.  Pt will demo full painfree motion of bil wrists to allow for dressing, grooming and light household tasks.  Baseline:  Goal status: INITIAL  4.   Pt will be able to drive without pain Baseline:  Goal status: INITIAL  5.  Pt will be able to open jars, open doorknobs, and carry work bag without pain Baseline:  Goal status: INITIAL     PLAN: PT FREQUENCY: 1x/week  PT DURATION: 8 weeks  PLANNED INTERVENTIONS: Therapeutic exercises, Therapeutic activity,  Neuromuscular re-education, Patient/Family education, Self Care, Joint mobilization, Cryotherapy, Moist heat, and Manual therapy  PLAN FOR NEXT SESSION: f/u on ortho visit, did she get repeat xrays, if cleared for activity begin HEP for wrist ROM, functional strength as tol, add STGs as needed intiially when Pt returns   [employer], PT 04/29/22 1:42 PM

## 2022-05-05 DIAGNOSIS — S52551A Other extraarticular fracture of lower end of right radius, initial encounter for closed fracture: Secondary | ICD-10-CM | POA: Diagnosis not present

## 2022-05-05 DIAGNOSIS — S52552A Other extraarticular fracture of lower end of left radius, initial encounter for closed fracture: Secondary | ICD-10-CM | POA: Diagnosis not present

## 2022-05-05 NOTE — Progress Notes (Deleted)
[facility] [place] Phone: [phone] Subjective:    I'm seeing this patient by the request  of:  Marin Olp, MD  CC:   [id number]  Dimple Bastyr is a 66 y.o. female coming in with complaint of B wrist sprain. Patient states   Onset-  Location Duration-  Character- Aggravating factors- Reliving factors-  Therapies tried-  Severity-     Past Medical History:  Diagnosis Date   Ganglion cyst    Migraine headache    Past Surgical History:  Procedure Laterality Date   BREAST CYST ASPIRATION     benign in 2011   [place]     left hand; left handed   Social History   Socioeconomic History   Marital status: Married    Spouse name: Not on file   Number of children: 1   Years of education: 16   Highest education level: Not on file  Occupational History   Occupation: Engineer, maintenance (IT)    Comment: work for [employer]  Tobacco Use   Smoking status: Never   Smokeless tobacco: Never  Substance and Sexual Activity   Alcohol use: Yes    Alcohol/week: 7.0 standard drinks of alcohol    Types: 7 Standard drinks or equivalent per week   Drug use: No   Sexual activity: Yes    Partners: Male  Other Topics Concern   Not on file  Social History Narrative   Married 1980. 1 dtr - '96 -[place]. Business. Engineer, maintenance (IT) for industry.       Hobbies: hike, walking, former runner, crafts, Firefighter, house in [place]. Baking.    Social Determinants of Health   Financial Resource Strain: Low Risk  (01/16/2022)   Overall Financial Resource Strain (CARDIA)    Difficulty of Paying Living Expenses: Not hard at all  Food Insecurity: No Food Insecurity (01/16/2022)   Hunger Vital Sign    Worried About Running Out of Food in the Last Year: Never true    Ran Out of Food in the Last Year: Never true  Transportation Needs: No Transportation Needs (01/16/2022)   PRAPARE - Armed forces logistics/support/administrative officer (Medical): No    Lack of Transportation (Non-Medical): No  Physical Activity: Sufficiently Active (01/16/2022)   Exercise Vital Sign    Days of Exercise per Week: 5 days    Minutes of Exercise per Session: 30 min  Stress: No Stress Concern Present (01/16/2022)   [place]    Feeling of Stress : Not at all  Social Connections: [place] (01/16/2022)   Social Connection and Isolation Panel [NHANES]    Frequency of Communication with Friends and Family: More than three times a week    Frequency of Social Gatherings with Friends and Family: More than three times a week    Attends Religious Services: More than 4 times per year    Active Member of [employer] or Organizations: Yes    Attends Archivist Meetings: 1 to 4 times per year    Marital Status: Married   Allergies  Allergen Reactions   Depo-Medrol [Methylprednisolone] Hives   Elemental Sulfur    Prednisone Hives   Cephalosporins Rash   Penicillins Rash   Family History  Problem Relation Age of Onset   Osteoporosis Mother    Arthritis Mother    Lung cancer Mother  died at 3. still driving and working at church   Kidney disease Father        amylodosis, died in 62s. Tear of aorta and was on blood thinners.    Asthma Brother    Heart disease Paternal Grandfather        79s   Stroke Paternal Grandfather    Diabetes Neg Hx    Hyperlipidemia Neg Hx    Hypertension Neg Hx    Cancer Neg Hx        Current Outpatient Medications (Analgesics):    SUMAtriptan (IMITREX) 100 MG tablet, 1 TABLET FOR MIGRAINE HEADACHE & CAN REPEAT IN 2 HRS IF PERSISTENT MAX OF 2/DAY   Current Outpatient Medications (Other):    Calcium Carbonate-Vitamin D (CALCIUM + D PO),     ESTRING 2 MG vaginal ring,    latanoprost (XALATAN) 0.005 % ophthalmic solution, SMARTSIG:In Eye(s)   Multiple Vitamins-Minerals (MULTIVITAMIN,TX-MINERALS)  tablet, Take 1 tablet by mouth daily.   Reviewed prior external information including notes and imaging from  primary care provider As well as notes that were available from care everywhere and other healthcare systems.  Past medical history, social, surgical and family history all reviewed in electronic medical record.  No pertanent information unless stated regarding to the chief complaint.   Review of Systems:  No headache, visual changes, nausea, vomiting, diarrhea, constipation, dizziness, abdominal pain, skin rash, fevers, chills, night sweats, weight loss, swollen lymph nodes, body aches, joint swelling, chest pain, shortness of breath, mood changes. POSITIVE muscle aches  Objective  There were no vitals taken for this visit.   General: No apparent distress alert and oriented x3 mood and affect normal, dressed appropriately.  HEENT: Pupils equal, extraocular movements intact  Respiratory: Patient's speak in full sentences and does not appear short of breath  Cardiovascular: No lower extremity edema, non tender, no erythema      Impression and Recommendations:

## 2022-05-07 DIAGNOSIS — H25812 Combined forms of age-related cataract, left eye: Secondary | ICD-10-CM | POA: Diagnosis not present

## 2022-05-07 DIAGNOSIS — H25012 Cortical age-related cataract, left eye: Secondary | ICD-10-CM | POA: Diagnosis not present

## 2022-05-07 DIAGNOSIS — H2512 Age-related nuclear cataract, left eye: Secondary | ICD-10-CM | POA: Diagnosis not present

## 2022-05-11 ENCOUNTER — Ambulatory Visit: Payer: Medicare PPO | Admitting: Family Medicine

## 2022-05-21 ENCOUNTER — Ambulatory Visit: Payer: Medicare PPO | Admitting: Physical Therapy

## 2022-05-25 ENCOUNTER — Encounter: Payer: Self-pay | Admitting: *Deleted

## 2022-05-28 ENCOUNTER — Encounter: Payer: Self-pay | Admitting: Physical Therapy

## 2022-05-28 ENCOUNTER — Ambulatory Visit: Payer: Medicare PPO | Attending: Nurse Practitioner | Admitting: Physical Therapy

## 2022-05-28 DIAGNOSIS — M25532 Pain in left wrist: Secondary | ICD-10-CM | POA: Diagnosis not present

## 2022-05-28 DIAGNOSIS — M6281 Muscle weakness (generalized): Secondary | ICD-10-CM | POA: Diagnosis not present

## 2022-05-28 DIAGNOSIS — M25531 Pain in right wrist: Secondary | ICD-10-CM | POA: Diagnosis not present

## 2022-05-28 NOTE — Patient Instructions (Signed)
Standing: Raise arms to front Raise arms to sides Arm circles with arms out at sides, shoulder height, forwards and backwards Overhead press/reach  Bent over in 1/4 squat: Shoulder extension (raise straight arms to back) Shoulder row Tricep kickback  Shoulder blade push ups weight bearing through forearms  Laying on back:  Dead bug alt opposite arm and leg  90/90 legs toe taps/temperature checks alternating legs

## 2022-05-28 NOTE — Therapy (Signed)
OUTPATIENT PHYSICAL THERAPY TREATMENT NOTE   Patient Name: Melinda Suarez MRN: [id number] DOB:02-Jun-1956, 66 y.o., female Today's Date: 05/28/2022   REFERRING PROVIDER: Bo Merino I, NP   END OF SESSION:   PT End of Session - 05/28/22 1142     Visit Number 2    Date for PT Re-Evaluation 06/24/22    Authorization Type [employer]    Authorization Time Period cohere approved 10 visits through 10/25    Authorization - Visit Number 1    Authorization - Number of Visits 10    Progress Note Due on Visit 10    PT Start Time 8563    PT Stop Time 1226    PT Time Calculation (min) 41 min    Activity Tolerance Patient tolerated treatment well    Behavior During Therapy WFL for tasks assessed/performed             Past Medical History:  Diagnosis Date   Ganglion cyst    Migraine headache    Past Surgical History:  Procedure Laterality Date   BREAST CYST ASPIRATION     benign in 2011   [place]     left hand; left handed   Patient Active Problem List   Diagnosis Date Noted   Left shoulder pain 12/05/2020   Other bursal cyst, right hand 04/18/2020   Caregiver stress 08/20/2016   Hyperlipidemia 07/18/2014   Osteoporosis 06/04/2014   Migraines 06/04/2014   Glaucoma 06/04/2014   History of disease of skin and subcutaneous tissue 07/02/2007    REFERRING DIAG: J49.702O (ICD-10-CM) - Other specified sprain of right wrist, initial encounter S63.592A (ICD-10-CM) - Other specified sprain of left wrist, initial encounter   THERAPY DIAG:  Pain in left wrist  Pain in right wrist  Muscle weakness (generalized)  Rationale for Evaluation and Treatment Rehabilitation  PERTINENT HISTORY: Ganglion cyst on Lt, migraine headaches, osteopenia   PRECAUTIONS: osteopenia  ONSET DATE: one week ago, fell backwards at yoga class and landed on outstretched hands  SUBJECTIVE: I did see ortho MD and he did repeat xrays and did have hairline fractures in both  wrists.  I am now 5 weeks ago yesterday from initial fall.     PAIN:  Are you having pain? Yes NPRS scale: 3-4/10 Pain location: both wrists Pain orientation: Right and Left  PAIN TYPE: aching and sharp Pain description: intermittent  Aggravating factors: picking things up even with braces on Relieving factors: rest, wearing braces   OBJECTIVE: (objective measures completed at initial evaluation unless otherwise dated)  OBJECTIVE:    DIAGNOSTIC FINDINGS:  Xrays bil wrists negative for fractures at urgent care   PATIENT SURVEYS:  FOTO 35%, goal 63%   COGNITION:           Overall cognitive status: Within functional limits for tasks assessed                                  SENSATION: WFL   POSTURE: WNL   UPPER EXTREMITY ROM:    Active ROM Right eval Left eval  Shoulder flexion      Shoulder extension      Shoulder abduction      Shoulder adduction      Shoulder internal rotation      Shoulder external rotation      Elbow flexion      Elbow extension      Wrist flexion 50% 50%  Wrist extension 40% 30%  Wrist ulnar deviation 50% 50%  Wrist radial deviation 50% 50%  Wrist pronation      Wrist supination      (Blank rows = not tested)   UPPER EXTREMITY MMT: Not formally assessed, weak bil grip strength       JOINT MOBILITY TESTING:  N/a   PALPATION:  Exquisite tenderness distal radius bil Significant bruising without edema bil anterior forearms at and above wrist             TODAY'S TREATMENT:  05/28/22: Discussion of timeline for healing, will need to wait for ortho follow up to clear for wrist directed PT, can do and should do shoulder and elbow mobility today for prevention of stiffness Standing shoulder A/ROM: flexion, shoulder press, abd, extension in 1/4 squat, row in 1/4 squat, tricep kickback in 1/4 squat Serratus push ups forearms on wall x 10 Ball on wall squats with bil shoulder flexion x 20 Wall squat sit with bil UE small circles at 90 deg  abd x 10 each way alt x 4 cycles Dying bug x 20, then opp arm flex and leg ext x 20 Supine 90/90 toe taps to table, use scapulae and upper arms to stabilize into table  Eval:  Discussion of evaluation findings Recommend seeing orthopedist before proceeding with any treatment plan     PATIENT EDUCATION: Education details: recommend see ortho MD first before treatment plan Person educated: Patient Education method: Explanation Education comprehension: verbalized understanding     HOME EXERCISE PROGRAM: Standing: Raise arms to front Raise arms to sides Arm circles with arms out at sides, shoulder height, forwards and backwards Overhead press/reach   Bent over in 1/4 squat: Shoulder extension (raise straight arms to back) Shoulder row Tricep kickback   Shoulder blade push ups weight bearing through forearms   Laying on back:  Dead bug alt opposite arm and leg  90/90 legs toe taps/temperature checks alternating legs   ASSESSMENT:   CLINICAL IMPRESSION: Patient had repeat xrays following initial evaluation and had hairline fractures of both wrists.  She continues to wear braces bil.  She is 5 weeks + 1 day out from initial injury and returns to MD next week.  He told her 6-8 weeks for healing so PT focused on shoulder and elbow ROM and core with overlay of UE/LE movement to prevent stiffness and weakness up the chain and in trunk as she heals.  HEP typed up and discussed that Pt needs ortho visit before next PT visit to determine clearance for wrist directed therex.     OBJECTIVE IMPAIRMENTS decreased activity tolerance, decreased mobility, decreased ROM, decreased strength, impaired UE functional use, and pain.    ACTIVITY LIMITATIONS carrying, lifting, bathing, toileting, dressing, and hygiene/grooming   PARTICIPATION LIMITATIONS: meal prep, cleaning, laundry, driving, community activity, occupation, and yard work   PERSONAL FACTORS 1 comorbidity: osteopenia  are also  affecting patient's functional outcome.    REHAB POTENTIAL: Good   CLINICAL DECISION MAKING: Stable/uncomplicated   EVALUATION COMPLEXITY: Low     GOALS: Goals reviewed with patient? Yes   SHORT TERM GOALS: Target date: 05/27/22 (Remove Blue Hyperlink)   Pt will see orthopedist before returning to clinic Baseline: Goal status: INITIAL   2.   Baseline:  Goal status:    3.   Baseline:  Goal status:    4.   Baseline:  Goal status:    5.   Baseline:  Goal status:    6.  Baseline:  Goal status:    LONG TERM GOALS: Target date: 06/24/22 (Remove Blue Hyperlink)   Pt will be ind with advanced HEP to maximize return of function of bil hands/UE. Baseline:  Goal status: INITIAL   2.  Pt will score at least 63% on FOTO to demo improved functional use of hands.  Baseline: 35% Goal status: INITIAL   3.  Pt will demo full painfree motion of bil wrists to allow for dressing, grooming and light household tasks.  Baseline:  Goal status: INITIAL   4.  Pt will be able to drive without pain Baseline:  Goal status: INITIAL   5.  Pt will be able to open jars, open doorknobs, and carry work bag without pain Baseline:  Goal status: INITIAL         PLAN: PT FREQUENCY: 1x/week   PT DURATION: 8 weeks   PLANNED INTERVENTIONS: Therapeutic exercises, Therapeutic activity, Neuromuscular re-education, Patient/Family education, Self Care, Joint mobilization, Cryotherapy, Moist heat, and Manual therapy   PLAN FOR NEXT SESSION: f/u on ortho visit, proceed with wrist directed PT if cleared     Lailee Hoelzel, PT 05/28/22 12:29 PM

## 2022-06-04 ENCOUNTER — Ambulatory Visit: Payer: Medicare PPO | Admitting: Physical Therapy

## 2022-06-10 DIAGNOSIS — S52551D Other extraarticular fracture of lower end of right radius, subsequent encounter for closed fracture with routine healing: Secondary | ICD-10-CM | POA: Diagnosis not present

## 2022-06-10 DIAGNOSIS — S52552D Other extraarticular fracture of lower end of left radius, subsequent encounter for closed fracture with routine healing: Secondary | ICD-10-CM | POA: Diagnosis not present

## 2022-06-11 ENCOUNTER — Ambulatory Visit: Payer: Medicare PPO | Admitting: Physical Therapy

## 2022-06-18 ENCOUNTER — Ambulatory Visit: Payer: Medicare PPO | Attending: Nurse Practitioner | Admitting: Physical Therapy

## 2022-06-18 ENCOUNTER — Encounter: Payer: Self-pay | Admitting: Physical Therapy

## 2022-06-18 DIAGNOSIS — M25532 Pain in left wrist: Secondary | ICD-10-CM | POA: Diagnosis not present

## 2022-06-18 DIAGNOSIS — M25531 Pain in right wrist: Secondary | ICD-10-CM | POA: Diagnosis not present

## 2022-06-18 DIAGNOSIS — M6281 Muscle weakness (generalized): Secondary | ICD-10-CM | POA: Diagnosis not present

## 2022-06-18 NOTE — Therapy (Signed)
OUTPATIENT PHYSICAL THERAPY TREATMENT NOTE   Patient Name: Melinda Suarez MRN: [id number] DOB:[birth year], 66 y.o., female Today's Date: 06/18/2022   REFERRING PROVIDER: Bo Merino I, NP   END OF SESSION:   PT End of Session - 06/18/22 1148     Visit Number 3    Authorization Type [employer]    Authorization Time Period cohere approved 10 visits through 10/25    Authorization - Visit Number 2    Authorization - Number of Visits 10    Progress Note Due on Visit 10    PT Start Time 1148    PT Stop Time 1230    PT Time Calculation (min) 42 min    Activity Tolerance Patient tolerated treatment well    Behavior During Therapy WFL for tasks assessed/performed              Past Medical History:  Diagnosis Date   Ganglion cyst    Migraine headache    Past Surgical History:  Procedure Laterality Date   BREAST CYST ASPIRATION     benign in 2011   [place]     left hand; left handed   Patient Active Problem List   Diagnosis Date Noted   Left shoulder pain 12/05/2020   Other bursal cyst, right hand 04/18/2020   Caregiver stress 08/20/2016   Hyperlipidemia 07/18/2014   Osteoporosis 06/04/2014   Migraines 06/04/2014   Glaucoma 06/04/2014   History of disease of skin and subcutaneous tissue 07/02/2007    REFERRING DIAG: F90.122U (ICD-10-CM) - Other specified sprain of right wrist, initial encounter S63.592A (ICD-10-CM) - Other specified sprain of left wrist, initial encounter   THERAPY DIAG:  Pain in left wrist - Plan: PT plan of care cert/re-cert  Pain in right wrist - Plan: PT plan of care cert/re-cert  Muscle weakness (generalized) - Plan: PT plan of care cert/re-cert  Rationale for Evaluation and Treatment Rehabilitation  PERTINENT HISTORY: Ganglion cyst on Lt, migraine headaches, osteopenia   PRECAUTIONS: osteopenia, no lifting >10lb until end of Oct  ONSET DATE: one week ago, fell backwards at yoga class and landed on  outstretched hands  SUBJECTIVE: I am cleared by ortho with full fracture healing in bil wrists.  Lt wrist is 90-95% improved for pain and use although I have been limiting lifting.  MD says no lifting >10lb for a few more weeks.  I use my mouse with my Rt hand typing numbers at work and it is getting aggravated.  I have to work though.  I might need to get a Lt handed mouse.  My brace no longer fits b/c I am not swollen anymore.    PAIN:  Are you having pain? Yes NPRS scale: 0-1/10 Lt wrist, 4/10 Rt wrist Pain location: both wrists Pain orientation: Right and Left  PAIN TYPE: aching and sharp Pain description: intermittent  Aggravating factors: twisting doorknobs, using mouse and typing for work Relieving factors: rest   OBJECTIVE: (objective measures completed at initial evaluation unless otherwise dated)  OBJECTIVE:    DIAGNOSTIC FINDINGS:  Update since initial visit: PT recommended repeat xrays and hairline fractures present in bil wrists (radius) Xrays bil wrists negative for fractures at urgent care   PATIENT SURVEYS:  FOTO 10/19: 51%  FOTO 35%, goal 63%   COGNITION:           Overall cognitive status: Within functional limits for tasks assessed  SENSATION: WFL   POSTURE: WNL   UPPER EXTREMITY ROM:    Active ROM Right eval Left eval Right 10/19 Left 10/19  Shoulder flexion        Shoulder extension        Shoulder abduction        Shoulder adduction        Shoulder internal rotation        Shoulder external rotation        Elbow flexion        Elbow extension        Wrist flexion 50% 50% 70% End range stiffness  Wrist extension 40% 30% 70% End range stiffness  Wrist ulnar deviation 50% 50% 75% End range stiffness  Wrist radial deviation 50% 50% 75% End range stiffness  Wrist pronation        Wrist supination        (Blank rows = not tested)   UPPER EXTREMITY MMT: 06/18/22: Lt wrist: at least 4/5, PT did not test to  full strength Rt wrist: at least 3+/5, PT did not test to full strength, pain on ulnar and radial deviation Grip test: Lt 40lb, Rt 35lb Pinch grip: Lt 14lb, Rt 16lb  Eval: Not formally assessed, weak bil grip strength       JOINT MOBILITY TESTING:  N/a   PALPATION:  06/18/22:  Eval: Exquisite tenderness distal radius bil Significant bruising without edema bil anterior forearms at and above wrist             TODAY'S TREATMENT:  06/18/22: Wrist ROM and strength assessment, palpation Discussion of options for Rt wrist brace to support and limit painful motion for required mouse and typing tasks New HEP for wrist ROM, wrist stretching, isometrics and towel squeeze issued (see below)  05/28/22: Discussion of timeline for healing, will need to wait for ortho follow up to clear for wrist directed PT, can do and should do shoulder and elbow mobility today for prevention of stiffness Standing shoulder A/ROM: flexion, shoulder press, abd, extension in 1/4 squat, row in 1/4 squat, tricep kickback in 1/4 squat Serratus push ups forearms on wall x 10 Ball on wall squats with bil shoulder flexion x 20 Wall squat sit with bil UE small circles at 90 deg abd x 10 each way alt x 4 cycles Dying bug x 20, then opp arm flex and leg ext x 20 Supine 90/90 toe taps to table, use scapulae and upper arms to stabilize into table  Eval:  Discussion of evaluation findings Recommend seeing orthopedist before proceeding with any treatment plan     PATIENT EDUCATION: Education details: recommend see ortho MD first before treatment plan Person educated: Patient Education method: Explanation Education comprehension: verbalized understanding     HOME EXERCISE PROGRAM: 10/19: Wrist range of motion: Circles, up/down/side to side - 3x/day 10 movements each Overpressure stretching, no pain just a stretch up/down/side/side - 3x/day 2 reps 10-15 sec holds each way Isometric strengthening: 3x/day with wrist  in neutral position, use opposite hand to press each of 4 directions and hold 5 sec each position, cycle through 5 reps each hand Towel roll squeezes to tolerance 3x/day 10 reps 5 sec holds  Standing: Raise arms to front Raise arms to sides Arm circles with arms out at sides, shoulder height, forwards and backwards Overhead press/reach   Bent over in 1/4 squat: Shoulder extension (raise straight arms to back) Shoulder row Tricep kickback   Shoulder blade push ups weight bearing through  forearms   Laying on back:  Dead bug alt opposite arm and leg  90/90 legs toe taps/temperature checks alternating legs   ASSESSMENT:   CLINICAL IMPRESSION: Patient is cleared for activity as tol with good bony callus formation per ortho MD.  Pt reports ongoing Rt wrist pain with work tasks such as using mouse and typing.  Lt wrist is 90% improved.  She has ongoing limitation in Rt wrist ROM with pain with ulnar/radial deviation and pronation/supination.  She cannot open doorknobs with Rt UE yet.  Discussed option for Rt wrist brace, getting Lt handed mouse to alt UE tasks at work, and issued updated HEP for wrist ROM, stretching and initial strength with isometrics and towel squeeze.       OBJECTIVE IMPAIRMENTS decreased activity tolerance, decreased mobility, decreased ROM, decreased strength, impaired UE functional use, and pain.    ACTIVITY LIMITATIONS carrying, lifting, bathing, toileting, dressing, and hygiene/grooming   PARTICIPATION LIMITATIONS: meal prep, cleaning, laundry, driving, community activity, occupation, and yard work   PERSONAL FACTORS 1 comorbidity: osteopenia  are also affecting patient's functional outcome.    REHAB POTENTIAL: Good   CLINICAL DECISION MAKING: Stable/uncomplicated   EVALUATION COMPLEXITY: Low     GOALS: Goals reviewed with patient? Yes   SHORT TERM GOALS: Target date: 05/27/22 (Remove Blue Hyperlink)   Pt will see orthopedist before returning to  clinic Baseline: Goal status: INITIAL   2.   Baseline:  Goal status:    3.   Baseline:  Goal status:    4.   Baseline:  Goal status:    5.   Baseline:  Goal status:    6.   Baseline:  Goal status:    LONG TERM GOALS: Target date: 06/24/22 (Remove Blue Hyperlink)   Pt will be ind with advanced HEP to maximize return of function of bil hands/UE. Baseline:  Goal status: initiated wrist specific HEP 06/18/22 upon clearance from MD, fractures have healed   2.  Pt will score at least 63% on FOTO to demo improved functional use of hands.  Baseline: 35%, 51% 06/18/22 Goal status: ongoing   3.  Pt will demo full painfree motion of bil wrists to allow for dressing, grooming and light household tasks.  Baseline:  Goal status: met for Lt, ongoing for Rt   4.  Pt will be able to drive without pain Baseline:  Goal status: ongoing   5.  Pt will be able to open jars, open doorknobs, and carry work bag without pain Baseline:  Goal status: ongoing, more progress with Lt than Rt         PLAN: PT FREQUENCY: 1x/week   PT DURATION: 8 weeks   PLANNED INTERVENTIONS: Therapeutic exercises, Therapeutic activity, Neuromuscular re-education, Patient/Family education, Self Care, Joint mobilization, Cryotherapy, Moist heat, and Manual therapy   PLAN FOR NEXT SESSION: f/u on new HEP (list above), progress as tol for wrist strength and function esp Rt more painful side, how is work going with mouse/typing/splint, did Pt get a Lt handed mouse or other splint?     Aviyanna Colbaugh, PT 06/18/22 2:58 PM

## 2022-06-18 NOTE — Patient Instructions (Signed)
Wrist range of motion: Circles, up/down/side to side - 3x/day 10 movements each Overpressure stretching, no pain just a stretch up/down/side/side - 3x/day 2 reps 10-15 sec holds each way Isometric strengthening: 3x/day with wrist in neutral position, use opposite hand to press each of 4 directions and hold 5 sec each position, cycle through 5 reps each hand Towel roll squeezes to tolerance 3x/day 10 reps 5 sec holds

## 2022-06-25 ENCOUNTER — Ambulatory Visit: Payer: Medicare PPO | Admitting: Physical Therapy

## 2022-06-25 ENCOUNTER — Encounter: Payer: Self-pay | Admitting: Physical Therapy

## 2022-06-25 DIAGNOSIS — M6281 Muscle weakness (generalized): Secondary | ICD-10-CM | POA: Diagnosis not present

## 2022-06-25 DIAGNOSIS — M25531 Pain in right wrist: Secondary | ICD-10-CM

## 2022-06-25 DIAGNOSIS — M25532 Pain in left wrist: Secondary | ICD-10-CM

## 2022-06-25 NOTE — Therapy (Signed)
OUTPATIENT PHYSICAL THERAPY TREATMENT NOTE   Patient Name: Melinda Suarez MRN: [id number] DOB:Nov 17, 1955, 66 y.o., female Today's Date: 06/25/2022   REFERRING PROVIDER: Bo Merino I, NP   END OF SESSION:   PT End of Session - 06/25/22 1145     Visit Number 4    Date for PT Re-Evaluation 08/14/22    Authorization Type [employer]    Authorization Time Period cohere approved 4 visits 10-26 though 12/15    Authorization - Visit Number 3    Authorization - Number of Visits 6    Progress Note Due on Visit 10    PT Start Time 6759    PT Stop Time 1227    PT Time Calculation (min) 42 min    Activity Tolerance Patient tolerated treatment well    Behavior During Therapy WFL for tasks assessed/performed               Past Medical History:  Diagnosis Date   Ganglion cyst    Migraine headache    Past Surgical History:  Procedure Laterality Date   BREAST CYST ASPIRATION     benign in 2011   [place]     left hand; left handed   Patient Active Problem List   Diagnosis Date Noted   Left shoulder pain 12/05/2020   Other bursal cyst, right hand 04/18/2020   Caregiver stress 08/20/2016   Hyperlipidemia 07/18/2014   Osteoporosis 06/04/2014   Migraines 06/04/2014   Glaucoma 06/04/2014   History of disease of skin and subcutaneous tissue 07/02/2007    REFERRING DIAG: F63.846K (ICD-10-CM) - Other specified sprain of right wrist, initial encounter S63.592A (ICD-10-CM) - Other specified sprain of left wrist, initial encounter   THERAPY DIAG:  Pain in left wrist  Pain in right wrist  Muscle weakness (generalized)  Rationale for Evaluation and Treatment Rehabilitation  PERTINENT HISTORY: Ganglion cyst on Lt, migraine headaches, osteopenia   PRECAUTIONS: osteopenia, no lifting >10lb until end of Oct  ONSET DATE: one week ago, fell backwards at yoga class and landed on outstretched hands  SUBJECTIVE: I double wrapped my Rt wrist brace which was  more effective while working.  I was able to program my mouse into a Lt mouse which helped my Rt wrist take breaks during work.  I can now open loose jars and twist doorknobs.   PAIN:  Are you having pain? Yes NPRS scale: 0-1/10 Lt wrist, 2/10 Rt wrist Pain location: both wrists Pain orientation: Right and Left  PAIN TYPE: aching and sharp Pain description: intermittent  Aggravating factors: twisting doorknobs, using mouse and typing for work Relieving factors: rest   OBJECTIVE: (objective measures completed at initial evaluation unless otherwise dated)  OBJECTIVE:    DIAGNOSTIC FINDINGS:  Update since initial visit: PT recommended repeat xrays and hairline fractures present in bil wrists (radius) Xrays bil wrists negative for fractures at urgent care   PATIENT SURVEYS:  FOTO 10/19: 51%  FOTO 35%, goal 63%   COGNITION:           Overall cognitive status: Within functional limits for tasks assessed                                  SENSATION: WFL   POSTURE: WNL   UPPER EXTREMITY ROM:    Active ROM Right eval Left eval Right 10/19 Left 10/19  Shoulder flexion        Shoulder extension  Shoulder abduction        Shoulder adduction        Shoulder internal rotation        Shoulder external rotation        Elbow flexion        Elbow extension        Wrist flexion 50% 50% 70% End range stiffness  Wrist extension 40% 30% 70% End range stiffness  Wrist ulnar deviation 50% 50% 75% End range stiffness  Wrist radial deviation 50% 50% 75% End range stiffness  Wrist pronation        Wrist supination        (Blank rows = not tested)   UPPER EXTREMITY MMT: 06/18/22: Lt wrist: at least 4/5, PT did not test to full strength Rt wrist: at least 3+/5, PT did not test to full strength, pain on ulnar and radial deviation Grip test: Lt 40lb, Rt 35lb Pinch grip: Lt 14lb, Rt 16lb  Eval: Not formally assessed, weak bil grip strength       JOINT MOBILITY TESTING:   N/a   PALPATION:  06/18/22:  Eval: Exquisite tenderness distal radius bil Significant bruising without edema bil anterior forearms at and above wrist             TODAY'S TREATMENT:  06/25/22 UBE L1 alt each min fwd/bwd x 4' PT present to monitor (increased to L2 for 2nd round) 2lb UE dumbbells: 10x bicep curls with supination 10x tricep kickbacks 10x Fwd raise and lateral raise  10x overhead press  Seated wrist circles 10x each way Wrist ext x 10 with overpressure self-stretch on last rep Wrist flexion curl, radial and ulnar deviation each x 10 reps against gravity Right wrist self-stretch flexion and ext 3x10" each Towel squeeze 5x10" holds each UE Standing bil red tband row with handles grip 2x15 Standing bil red tband shoulder ext 1x15 Standing doorway chest stretch 2x20" Velcro board long handle Lt wrist x4 passes flex/ext and 4x sup/pron  Velcro board long handle Lt wrist x3 passes flex/ext and x3 sup/pron    06/18/22: Wrist ROM and strength assessment, palpation Discussion of options for Rt wrist brace to support and limit painful motion for required mouse and typing tasks New HEP for wrist ROM, wrist stretching, isometrics and towel squeeze issued (see below)  05/28/22: Discussion of timeline for healing, will need to wait for ortho follow up to clear for wrist directed PT, can do and should do shoulder and elbow mobility today for prevention of stiffness Standing shoulder A/ROM: flexion, shoulder press, abd, extension in 1/4 squat, row in 1/4 squat, tricep kickback in 1/4 squat Serratus push ups forearms on wall x 10 Ball on wall squats with bil shoulder flexion x 20 Wall squat sit with bil UE small circles at 90 deg abd x 10 each way alt x 4 cycles Dying bug x 20, then opp arm flex and leg ext x 20 Supine 90/90 toe taps to table, use scapulae and upper arms to stabilize into table  Eval:  Discussion of evaluation findings Recommend seeing orthopedist before  proceeding with any treatment plan     PATIENT EDUCATION: Education details: recommend see ortho MD first before treatment plan Person educated: Patient Education method: Explanation Education comprehension: verbalized understanding     HOME EXERCISE PROGRAM 10/26: 2lb UE dumbbells: 10x bicep curls with supination 10x tricep kickbacks 10x Fwd raise and lateral raise  10x overhead press  Seated wrist circles 10x each way Wrist ext x  10 with overpressure self-stretch on last rep Wrist flexion curl, radial and ulnar deviation each x 10 reps against gravity Right wrist self-stretch flexion and ext 3x10" each Towel squeeze 5x10" holds each UE Standing bil red tband row with handles grip 2x15 Standing bil red tband shoulder ext 1x15 Standing doorway chest stretch 2x20" Velcro board long handle Lt wrist x4 passes flex/ext and 4x sup/pron  Velcro board long handle Lt wrist x3 passes flex/ext and x3 sup/pron   10/19: Wrist range of motion: Circles, up/down/side to side - 3x/day 10 movements each Overpressure stretching, no pain just a stretch up/down/side/side - 3x/day 2 reps 10-15 sec holds each way Isometric strengthening: 3x/day with wrist in neutral position, use opposite hand to press each of 4 directions and hold 5 sec each position, cycle through 5 reps each hand Towel roll squeezes to tolerance 3x/day 10 reps 5 sec holds  Standing: Raise arms to front Raise arms to sides Arm circles with arms out at sides, shoulder height, forwards and backwards Overhead press/reach   Bent over in 1/4 squat: Shoulder extension (raise straight arms to back) Shoulder row Tricep kickback   Shoulder blade push ups weight bearing through forearms   Laying on back:  Dead bug alt opposite arm and leg  90/90 legs toe taps/temperature checks alternating legs   ASSESSMENT:   CLINICAL IMPRESSION: Patient continues to make progress with wrist ROM, strength and activity tolerance.  She has  end range restriction in Rt wrist in all planes compared to full ROM on Lt.  She was able to progress therex with some fatigue and slight pain in Rt wrist with today's therex with quick recovery after each exercise.  Progressed HEP to reflect today's therex.       OBJECTIVE IMPAIRMENTS decreased activity tolerance, decreased mobility, decreased ROM, decreased strength, impaired UE functional use, and pain.    ACTIVITY LIMITATIONS carrying, lifting, bathing, toileting, dressing, and hygiene/grooming   PARTICIPATION LIMITATIONS: meal prep, cleaning, laundry, driving, community activity, occupation, and yard work   PERSONAL FACTORS 1 comorbidity: osteopenia  are also affecting patient's functional outcome.    REHAB POTENTIAL: Good   CLINICAL DECISION MAKING: Stable/uncomplicated   EVALUATION COMPLEXITY: Low     GOALS: Goals reviewed with patient? Yes   SHORT TERM GOALS: Target date: 05/27/22 (Remove Blue Hyperlink)   Pt will see orthopedist before returning to clinic Baseline: Goal status: INITIAL   2.   Baseline:  Goal status:    3.   Baseline:  Goal status:    4.   Baseline:  Goal status:    5.   Baseline:  Goal status:    6.   Baseline:  Goal status:    LONG TERM GOALS: Target date: 06/24/22 (Remove Blue Hyperlink)   Pt will be ind with advanced HEP to maximize return of function of bil hands/UE. Baseline:  Goal status: initiated wrist specific HEP 06/18/22 upon clearance from MD, fractures have healed   2.  Pt will score at least 63% on FOTO to demo improved functional use of hands.  Baseline: 35%, 51% 06/18/22 Goal status: ongoing   3.  Pt will demo full painfree motion of bil wrists to allow for dressing, grooming and light household tasks.  Baseline:  Goal status: met for Lt, ongoing for Rt   4.  Pt will be able to drive without pain Baseline:  Goal status: ongoing   5.  Pt will be able to open jars, open doorknobs, and carry work bag  without  pain Baseline:  Goal status: ongoing, more progress with Lt than Rt         PLAN: PT FREQUENCY: 1x/week   PT DURATION: 8 weeks   PLANNED INTERVENTIONS: Therapeutic exercises, Therapeutic activity, Neuromuscular re-education, Patient/Family education, Self Care, Joint mobilization, Cryotherapy, Moist heat, and Manual therapy   PLAN FOR NEXT SESSION: f/u on new HEP (list above), progress as tol for wrist strength and function esp Rt more painful side, how is work going with mouse/typing/splint, did Pt get a Lt handed mouse or other splint?     Erienne Spelman, PT 06/25/22 12:29 PM

## 2022-06-25 NOTE — Patient Instructions (Signed)
06/25/22  2lb UE dumbbells: 10x bicep curls with supination 10x tricep kickbacks 10x Fwd raise and lateral raise  10x overhead press  Seated wrist circles 10x each way Wrist ext x 10 with overpressure self-stretch on last rep Wrist flexion curl, radial and ulnar deviation each x 10 reps against gravity Right wrist self-stretch flexion and ext 3x10" each Towel squeeze 5x10" holds each UE Standing bil red tband row with handles grip 2x15 Standing bil red tband shoulder ext 1x15 Standing doorway chest stretch 2x20" Velcro board long handle Lt wrist x4 passes flex/ext and 4x sup/pron  Velcro board long handle Lt wrist x3 passes flex/ext and x3 sup/pron

## 2022-07-01 ENCOUNTER — Ambulatory Visit: Payer: Medicare PPO | Attending: Nurse Practitioner | Admitting: Physical Therapy

## 2022-07-01 ENCOUNTER — Encounter: Payer: Self-pay | Admitting: Physical Therapy

## 2022-07-01 DIAGNOSIS — M6281 Muscle weakness (generalized): Secondary | ICD-10-CM | POA: Insufficient documentation

## 2022-07-01 DIAGNOSIS — M25532 Pain in left wrist: Secondary | ICD-10-CM | POA: Diagnosis not present

## 2022-07-01 DIAGNOSIS — M25531 Pain in right wrist: Secondary | ICD-10-CM | POA: Diagnosis not present

## 2022-07-01 NOTE — Therapy (Signed)
OUTPATIENT PHYSICAL THERAPY TREATMENT NOTE   Patient Name: Melinda Suarez MRN: [id number] DOB:01-22-1956, 66 y.o., female Today's Date: 07/01/2022   REFERRING PROVIDER: Bo Merino I, NP   END OF SESSION:   PT End of Session - 07/01/22 0801     Visit Number 5    Date for PT Re-Evaluation 08/14/22    Authorization Type [employer]    Authorization Time Period cohere approved 4 visits 10-26 though 12/15    Authorization - Visit Number 4    Authorization - Number of Visits 6    Progress Note Due on Visit 10    PT Start Time 0802    PT Stop Time 0840    PT Time Calculation (min) 38 min    Activity Tolerance Patient tolerated treatment well    Behavior During Therapy WFL for tasks assessed/performed                Past Medical History:  Diagnosis Date   Ganglion cyst    Migraine headache    Past Surgical History:  Procedure Laterality Date   BREAST CYST ASPIRATION     benign in 2011   [place]     left hand; left handed   Patient Active Problem List   Diagnosis Date Noted   Left shoulder pain 12/05/2020   Other bursal cyst, right hand 04/18/2020   Caregiver stress 08/20/2016   Hyperlipidemia 07/18/2014   Osteoporosis 06/04/2014   Migraines 06/04/2014   Glaucoma 06/04/2014   History of disease of skin and subcutaneous tissue 07/02/2007    REFERRING DIAG: B84.665L (ICD-10-CM) - Other specified sprain of right wrist, initial encounter S63.592A (ICD-10-CM) - Other specified sprain of left wrist, initial encounter   THERAPY DIAG:  Pain in left wrist  Pain in right wrist  Muscle weakness (generalized)  Rationale for Evaluation and Treatment Rehabilitation  PERTINENT HISTORY: Ganglion cyst on Lt, migraine headaches, osteopenia   PRECAUTIONS: osteopenia, no lifting >10lb until end of Oct  ONSET DATE: one week ago, fell backwards at yoga class and landed on outstretched hands  SUBJECTIVE: I continue to improve.  My right wrist is  making progress toward being more like my Lt.  I am using the mouse in my Rt hand more without increased pain.  Lifting something heavier is my hardest thing now.  I can do doorknobs and jars.   PAIN:  Are you having pain? Yes NPRS scale: 0/10 Lt wrist, 0/10 Rt wrist - Rt wrist hurts only when lifting heavy Pain location: both wrists Pain orientation: Right and Left  PAIN TYPE: aching and sharp Pain description: intermittent  Aggravating factors: twisting doorknobs, using mouse and typing for work Relieving factors: rest   OBJECTIVE: (objective measures completed at initial evaluation unless otherwise dated)  OBJECTIVE:    DIAGNOSTIC FINDINGS:  Update since initial visit: PT recommended repeat xrays and hairline fractures present in bil wrists (radius) Xrays bil wrists negative for fractures at urgent care   PATIENT SURVEYS:  FOTO 10/19: 51%  FOTO 35%, goal 63%   COGNITION:           Overall cognitive status: Within functional limits for tasks assessed                                  SENSATION: WFL   POSTURE: WNL   UPPER EXTREMITY ROM:    Active ROM Right eval Left eval Right 10/19 Left 10/19  Shoulder flexion        Shoulder extension        Shoulder abduction        Shoulder adduction        Shoulder internal rotation        Shoulder external rotation        Elbow flexion        Elbow extension        Wrist flexion 50% 50% 70% End range stiffness  Wrist extension 40% 30% 70% End range stiffness  Wrist ulnar deviation 50% 50% 75% End range stiffness  Wrist radial deviation 50% 50% 75% End range stiffness  Wrist pronation        Wrist supination        (Blank rows = not tested)   UPPER EXTREMITY MMT: 06/18/22: Lt wrist: at least 4/5, PT did not test to full strength Rt wrist: at least 3+/5, PT did not test to full strength, pain on ulnar and radial deviation Grip test: Lt 40lb, Rt 35lb Pinch grip: Lt 14lb, Rt 16lb  Eval: Not formally assessed, weak  bil grip strength       JOINT MOBILITY TESTING:  N/a   PALPATION:  06/18/22:  Eval: Exquisite tenderness distal radius bil Significant bruising without edema bil anterior forearms at and above wrist             TODAY'S TREATMENT:  07/01/22: UBE L1 fwd/bwd alt each min x 4' PT present to monitor and discuss progress 1lb weight held at 90 deg shoulder flexion 10 pulses each in supination, pronation, neutral then 10" holds, bil Seated wrist 1lb 2x10 each: flexion, extension, radial dev, ulnar dev Squat with single 5lb bil UE dumbbell chop/reverse chop x 10 each way Bent over row 5lb bil UE dumbbells Squat with 10lb box lift x 10 then carry 50' Rubber grip grid 20 squeezes each UE holding arm at 90 deg shoulder flexion (elbow straight with Lt UE, elbow bent Rt UE due to challenge) Wrist flexion and extension stretch 2x20" bil each UE Ball on wall circles 2x10 circles each way 1lb pronation/supination 2x10 bil wrists Standing bil row green tband x 20 2lb UE dumbbells: 2x10x bicep curls with supination 2x10x tricep kickbacks 10x fwd and lat raise x10 10x overhead press   06/25/22 UBE L1 alt each min fwd/bwd x 4' PT present to monitor (increased to L2 for 2nd round) 2lb UE dumbbells: 10x bicep curls with supination 10x tricep kickbacks 10x Fwd raise and lateral raise  10x overhead press  Seated wrist circles 10x each way Wrist ext x 10 with overpressure self-stretch on last rep Wrist flexion curl, radial and ulnar deviation each x 10 reps against gravity Right wrist self-stretch flexion and ext 3x10" each Towel squeeze 5x10" holds each UE Standing bil red tband row with handles grip 2x15 Standing bil red tband shoulder ext 1x15 Standing doorway chest stretch 2x20" Velcro board long handle Lt wrist x4 passes flex/ext and 4x sup/pron  Velcro board long handle Lt wrist x3 passes flex/ext and x3 sup/pron    06/18/22: Wrist ROM and strength assessment, palpation Discussion  of options for Rt wrist brace to support and limit painful motion for required mouse and typing tasks New HEP for wrist ROM, wrist stretching, isometrics and towel squeeze issued (see below)     PATIENT EDUCATION: Education details: recommend see ortho MD first before treatment plan Person educated: Patient Education method: Explanation Education comprehension: verbalized understanding     HOME  EXERCISE PROGRAM 10/26: 2lb UE dumbbells: 10x bicep curls with supination 10x tricep kickbacks 10x Fwd raise and lateral raise  10x overhead press  Seated wrist circles 10x each way Wrist ext x 10 with overpressure self-stretch on last rep Wrist flexion curl, radial and ulnar deviation each x 10 reps against gravity Right wrist self-stretch flexion and ext 3x10" each Towel squeeze 5x10" holds each UE Standing bil red tband row with handles grip 2x15 Standing bil red tband shoulder ext 1x15 Standing doorway chest stretch 2x20" Velcro board long handle Lt wrist x4 passes flex/ext and 4x sup/pron  Velcro board long handle Lt wrist x3 passes flex/ext and x3 sup/pron   10/19: Wrist range of motion: Circles, up/down/side to side - 3x/day 10 movements each Overpressure stretching, no pain just a stretch up/down/side/side - 3x/day 2 reps 10-15 sec holds each way Isometric strengthening: 3x/day with wrist in neutral position, use opposite hand to press each of 4 directions and hold 5 sec each position, cycle through 5 reps each hand Towel roll squeezes to tolerance 3x/day 10 reps 5 sec holds  Standing: Raise arms to front Raise arms to sides Arm circles with arms out at sides, shoulder height, forwards and backwards Overhead press/reach   Bent over in 1/4 squat: Shoulder extension (raise straight arms to back) Shoulder row Tricep kickback   Shoulder blade push ups weight bearing through forearms   Laying on back:  Dead bug alt opposite arm and leg  90/90 legs toe taps/temperature  checks alternating legs   ASSESSMENT:   CLINICAL IMPRESSION: Pt reports ongoing improvement in  functional use of bil UE following wrist fractures.  She rates pain in Rt wrist up to 1/10 with heavier lifting but otherwise full use of bil wrists.  She was able to perform heavier grasping with lifting and carrying today with good tolerance.  When stretching with overpressure Pt can achieve symmetry and full P/ROM for bil wrist flexion and extension.  She has returned to primarily using Rt handed mouse with good tolerance for her job.  Anticipate 1 more session and then d/c to HEP.     OBJECTIVE IMPAIRMENTS decreased activity tolerance, decreased mobility, decreased ROM, decreased strength, impaired UE functional use, and pain.    ACTIVITY LIMITATIONS carrying, lifting, bathing, toileting, dressing, and hygiene/grooming   PARTICIPATION LIMITATIONS: meal prep, cleaning, laundry, driving, community activity, occupation, and yard work   PERSONAL FACTORS 1 comorbidity: osteopenia  are also affecting patient's functional outcome.    REHAB POTENTIAL: Good   CLINICAL DECISION MAKING: Stable/uncomplicated   EVALUATION COMPLEXITY: Low     GOALS: Goals reviewed with patient? Yes   SHORT TERM GOALS: Target date: 05/27/22 (Remove Blue Hyperlink)   Pt will see orthopedist before returning to clinic Baseline: Goal status: MET   2.   Baseline:  Goal status:    3.   Baseline:  Goal status:    4.   Baseline:  Goal status:    5.   Baseline:  Goal status:    6.   Baseline:  Goal status:    LONG TERM GOALS: Target date: 06/24/22 (Remove Blue Hyperlink)   Pt will be ind with advanced HEP to maximize return of function of bil hands/UE. Baseline:  Goal status: initiated wrist specific HEP 06/18/22 upon clearance from MD, fractures have healed, ONGOING   2.  Pt will score at least 63% on FOTO to demo improved functional use of hands.  Baseline: 35%, 51% 06/18/22 Goal status: ongoing  3.  Pt will demo full painfree motion of bil wrists to allow for dressing, grooming and light household tasks.  Baseline:  Goal status: met for Lt, ongoing for Rt   4.  Pt will be able to drive without pain Baseline:  Goal status: MET   5.  Pt will be able to open jars, open doorknobs, and carry work bag without pain Baseline:  Goal status: MET         PLAN: PT FREQUENCY: 1x/week   PT DURATION: 8 weeks   PLANNED INTERVENTIONS: Therapeutic exercises, Therapeutic activity, Neuromuscular re-education, Patient/Family education, Self Care, Joint mobilization, Cryotherapy, Moist heat, and Manual therapy   PLAN FOR NEXT SESSION: f/u on HEP (list above), progress as tol for wrist strength, ROM, and function esp Rt more painful side   Ilan Kahrs, PT 07/01/22 8:41 AM

## 2022-07-08 ENCOUNTER — Encounter: Payer: Self-pay | Admitting: Physical Therapy

## 2022-07-08 ENCOUNTER — Ambulatory Visit: Payer: Medicare PPO | Admitting: Physical Therapy

## 2022-07-08 DIAGNOSIS — M6281 Muscle weakness (generalized): Secondary | ICD-10-CM | POA: Diagnosis not present

## 2022-07-08 DIAGNOSIS — M25531 Pain in right wrist: Secondary | ICD-10-CM | POA: Diagnosis not present

## 2022-07-08 DIAGNOSIS — M25532 Pain in left wrist: Secondary | ICD-10-CM | POA: Diagnosis not present

## 2022-07-08 NOTE — Therapy (Signed)
OUTPATIENT PHYSICAL THERAPY TREATMENT NOTE   Patient Name: Melinda Suarez MRN: [id number] DOB:09/30/1955, 66 y.o., female Today's Date: 07/08/2022   REFERRING PROVIDER: Bo Merino I, NP   END OF SESSION:   PT End of Session - 07/08/22 0800     Visit Number 6    Date for PT Re-Evaluation 08/14/22    Authorization Type [employer]    Authorization Time Period cohere approved 4 visits 10-26 though 12/15    Authorization - Visit Number 5    Authorization - Number of Visits 6    Progress Note Due on Visit 10    PT Start Time 0801    PT Stop Time 0841    PT Time Calculation (min) 40 min    Activity Tolerance Patient tolerated treatment well    Behavior During Therapy [facility] for tasks assessed/performed                 Past Medical History:  Diagnosis Date   Ganglion cyst    Migraine headache    Past Surgical History:  Procedure Laterality Date   BREAST CYST ASPIRATION     benign in 2011   [place]     left hand; left handed   Patient Active Problem List   Diagnosis Date Noted   Left shoulder pain 12/05/2020   Other bursal cyst, right hand 04/18/2020   Caregiver stress 08/20/2016   Hyperlipidemia 07/18/2014   Osteoporosis 06/04/2014   Migraines 06/04/2014   Glaucoma 06/04/2014   History of disease of skin and subcutaneous tissue 07/02/2007    REFERRING DIAG: C14.481E (ICD-10-CM) - Other specified sprain of right wrist, initial encounter S63.592A (ICD-10-CM) - Other specified sprain of left wrist, initial encounter   THERAPY DIAG:  Pain in left wrist  Pain in right wrist  Muscle weakness (generalized)  Rationale for Evaluation and Treatment Rehabilitation  PERTINENT HISTORY: Ganglion cyst on Lt, migraine headaches, osteopenia   PRECAUTIONS: osteopenia, no lifting >10lb until end of Oct  ONSET DATE: one week ago, fell backwards at yoga class and landed on outstretched hands  SUBJECTIVE: I am better and think I'm ready for d/c.   I have no pain in my wrists with any activities. I am working without my braces.  I am starting to be able to do more lifting with my Rt wrist.   PAIN:  Are you having pain? Yes NPRS scale: 0/10 Lt wrist, 0/10 Rt wrist - Rt wrist hurts only when lifting heavy Pain location: both wrists Pain orientation: Right and Left  PAIN TYPE: aching and sharp Pain description: intermittent  Aggravating factors: twisting doorknobs, using mouse and typing for work Relieving factors: rest   OBJECTIVE: (objective measures completed at initial evaluation unless otherwise dated)  OBJECTIVE:    DIAGNOSTIC FINDINGS:  Update since initial visit: PT recommended repeat xrays and hairline fractures present in bil wrists (radius) Xrays bil wrists negative for fractures at urgent care   PATIENT SURVEYS:  FOTO 11/7: 79%, MET GOAL FOTO 10/19: 51%  FOTO 35%, goal 63%   COGNITION:           Overall cognitive status: Within functional limits for tasks assessed                                  SENSATION: WFL   POSTURE: WNL   UPPER EXTREMITY ROM:    Active ROM Right eval Left eval Right 10/19 Left 10/19  11/8 11/8  Shoulder flexion          Shoulder extension          Shoulder abduction          Shoulder adduction          Shoulder internal rotation          Shoulder external rotation          Elbow flexion          Elbow extension          Wrist flexion 50% 50% 70% End range stiffness End range stiffness FULL  Wrist extension 40% 30% 70% End range stiffness End range stiffness FULL  Wrist ulnar deviation 50% 50% 75% End range stiffness FULL FULL  Wrist radial deviation 50% 50% 75% End range stiffness FULL FULL  Wrist pronation       FULL FULL  Wrist supination       End range stiffness FULL  (Blank rows = not tested)   UPPER EXTREMITY MMT: 07/08/22: Lt wrist:5/5 Rt wrist:5/5 Grip test:  06/18/22: Lt wrist: at least 4/5, PT did not test to full strength Rt wrist: at least 3+/5, PT did  not test to full strength, pain on ulnar and radial deviation Grip test: Lt 40lb, Rt 35lb Pinch grip: Lt 14lb, Rt 16lb  Eval: Not formally assessed, weak bil grip strength       JOINT MOBILITY TESTING:  N/a   PALPATION:  06/18/22:  Eval: Exquisite tenderness distal radius bil Significant bruising without edema bil anterior forearms at and above wrist             TODAY'S TREATMENT:  07/08/22: UBE L1.5 2x2 fwd/bwd PT present to review goals Standing lat pulldown 1x15 Standing row 10lb 1x15 Standing shoulder extension green band 1x15 Standing 4lb bil bicep curls, overhead press x 10  Bent over row to tricep kickback 3lb bil x 10 3lb held at 90 deg shoulder flexion sup/pron x 10 each UE Ball on wall circles 2x10 circles each way Weighted small green plyoball wall bounce and catch x 25 each Rubber grip grid 20 squeezes Seated wrist 1lb 2x10 each: flexion, extension, radial dev, ulnar dev, sup/pron hammer turns Velcro board pron/supin long handle x 3 laps each UE  07/01/22: UBE L1 fwd/bwd alt each min x 4' PT present to monitor and discuss progress 1lb weight held at 90 deg shoulder flexion 10 pulses each in supination, pronation, neutral then 10" holds, bil Seated wrist 1lb 2x10 each: flexion, extension, radial dev, ulnar dev Squat with single 5lb bil UE dumbbell chop/reverse chop x 10 each way Bent over row 5lb bil UE dumbbells Squat with 10lb box lift x 10 then carry 50' Rubber grip grid 20 squeezes each UE holding arm at 90 deg shoulder flexion (elbow straight with Lt UE, elbow bent Rt UE due to challenge) Wrist flexion and extension stretch 2x20" bil each UE Ball on wall circles 2x10 circles each way 1lb pronation/supination 2x10 bil wrists Standing bil row green tband x 20 2lb UE dumbbells: 2x10x bicep curls with supination 2x10x tricep kickbacks 10x fwd and lat raise x10 10x overhead press   06/25/22 UBE L1 alt each min fwd/bwd x 4' PT present to monitor (increased  to L2 for 2nd round) 2lb UE dumbbells: 10x bicep curls with supination 10x tricep kickbacks 10x Fwd raise and lateral raise  10x overhead press  Seated wrist circles 10x each way Wrist ext x 10 with overpressure self-stretch  on last rep Wrist flexion curl, radial and ulnar deviation each x 10 reps against gravity Right wrist self-stretch flexion and ext 3x10" each Towel squeeze 5x10" holds each UE Standing bil red tband row with handles grip 2x15 Standing bil red tband shoulder ext 1x15 Standing doorway chest stretch 2x20" Velcro board long handle Lt wrist x4 passes flex/ext and 4x sup/pron  Velcro board long handle Lt wrist x3 passes flex/ext and x3 sup/pron      PATIENT EDUCATION: Education details: recommend see ortho MD first before treatment plan Person educated: Patient Education method: Explanation Education comprehension: verbalized understanding     HOME EXERCISE PROGRAM 10/26: 2lb UE dumbbells: 10x bicep curls with supination 10x tricep kickbacks 10x Fwd raise and lateral raise  10x overhead press  Seated wrist circles 10x each way Wrist ext x 10 with overpressure self-stretch on last rep Wrist flexion curl, radial and ulnar deviation each x 10 reps against gravity Right wrist self-stretch flexion and ext 3x10" each Towel squeeze 5x10" holds each UE Standing bil red tband row with handles grip 2x15 Standing bil red tband shoulder ext 1x15 Standing doorway chest stretch 2x20" Velcro board long handle Lt wrist x4 passes flex/ext and 4x sup/pron  Velcro board long handle Lt wrist x3 passes flex/ext and x3 sup/pron   10/19: Wrist range of motion: Circles, up/down/side to side - 3x/day 10 movements each Overpressure stretching, no pain just a stretch up/down/side/side - 3x/day 2 reps 10-15 sec holds each way Isometric strengthening: 3x/day with wrist in neutral position, use opposite hand to press each of 4 directions and hold 5 sec each position, cycle through 5  reps each hand Towel roll squeezes to tolerance 3x/day 10 reps 5 sec holds  Standing: Raise arms to front Raise arms to sides Arm circles with arms out at sides, shoulder height, forwards and backwards Overhead press/reach   Bent over in 1/4 squat: Shoulder extension (raise straight arms to back) Shoulder row Tricep kickback   Shoulder blade push ups weight bearing through forearms   Laying on back:  Dead bug alt opposite arm and leg  90/90 legs toe taps/temperature checks alternating legs   ASSESSMENT:   CLINICAL IMPRESSION: Pt has met all goals and has returned to all activities without bracing or pain.  She has end range stiffness into Rt wrist flexion and supination.  FOTO score reached 79%, surpassing goal.  She is ready to d/c to HEP     OBJECTIVE IMPAIRMENTS decreased activity tolerance, decreased mobility, decreased ROM, decreased strength, impaired UE functional use, and pain.    ACTIVITY LIMITATIONS carrying, lifting, bathing, toileting, dressing, and hygiene/grooming   PARTICIPATION LIMITATIONS: meal prep, cleaning, laundry, driving, community activity, occupation, and yard work   PERSONAL FACTORS 1 comorbidity: osteopenia  are also affecting patient's functional outcome.    REHAB POTENTIAL: Good   CLINICAL DECISION MAKING: Stable/uncomplicated   EVALUATION COMPLEXITY: Low     GOALS: Goals reviewed with patient? Yes   SHORT TERM GOALS: Target date: 05/27/22 (Remove Blue Hyperlink)   Pt will see orthopedist before returning to clinic Baseline: Goal status: MET   2.   Baseline:  Goal status:    3.   Baseline:  Goal status:    4.   Baseline:  Goal status:    5.   Baseline:  Goal status:    6.   Baseline:  Goal status:    LONG TERM GOALS: Target date: 06/24/22 (Remove Blue Hyperlink)   Pt will be ind with  advanced HEP to maximize return of function of bil hands/UE. Baseline:  Goal status: initiated wrist specific HEP 06/18/22 upon  clearance from MD, fractures have healed, met   2.  Pt will score at least 63% on FOTO to demo improved functional use of hands.  Baseline: 35%, 51% 06/18/22 Goal status: met   3.  Pt will demo full painfree motion of bil wrists to allow for dressing, grooming and light household tasks.  Baseline:  Goal status: met   4.  Pt will be able to drive without pain Baseline:  Goal status: MET   5.  Pt will be able to open jars, open doorknobs, and carry work bag without pain Baseline:  Goal status: MET         PLAN: PT FREQUENCY: 1x/week   PT DURATION: 8 weeks   PLANNED INTERVENTIONS: Therapeutic exercises, Therapeutic activity, Neuromuscular re-education, Patient/Family education, Self Care, Joint mobilization, Cryotherapy, Moist heat, and Manual therapy   PLAN FOR NEXT SESSION: f/u on HEP (list above), progress as tol for wrist strength, ROM, and function esp Rt more painful side   PHYSICAL THERAPY DISCHARGE SUMMARY  Visits from Start of Care: 5  Current functional level related to goals / functional outcomes: See above   Remaining deficits: none   Education / Equipment: HEP   Patient agrees to discharge. Patient goals were met. Patient is being discharged due to meeting the stated rehab goals.  Kallie Depolo, PT 07/08/22 8:45 AM

## 2022-07-09 ENCOUNTER — Encounter: Payer: Medicare PPO | Admitting: Physical Therapy

## 2022-07-10 IMAGING — DX DG CERVICAL SPINE COMPLETE 4+V
5 series · 5 of 5 positions shown · non-contrast
Comparison: None.

CLINICAL DATA: Neck pain and chronic left shoulder pain

EXAM:
CERVICAL SPINE - COMPLETE 4+ VIEW

[c-spine lat]
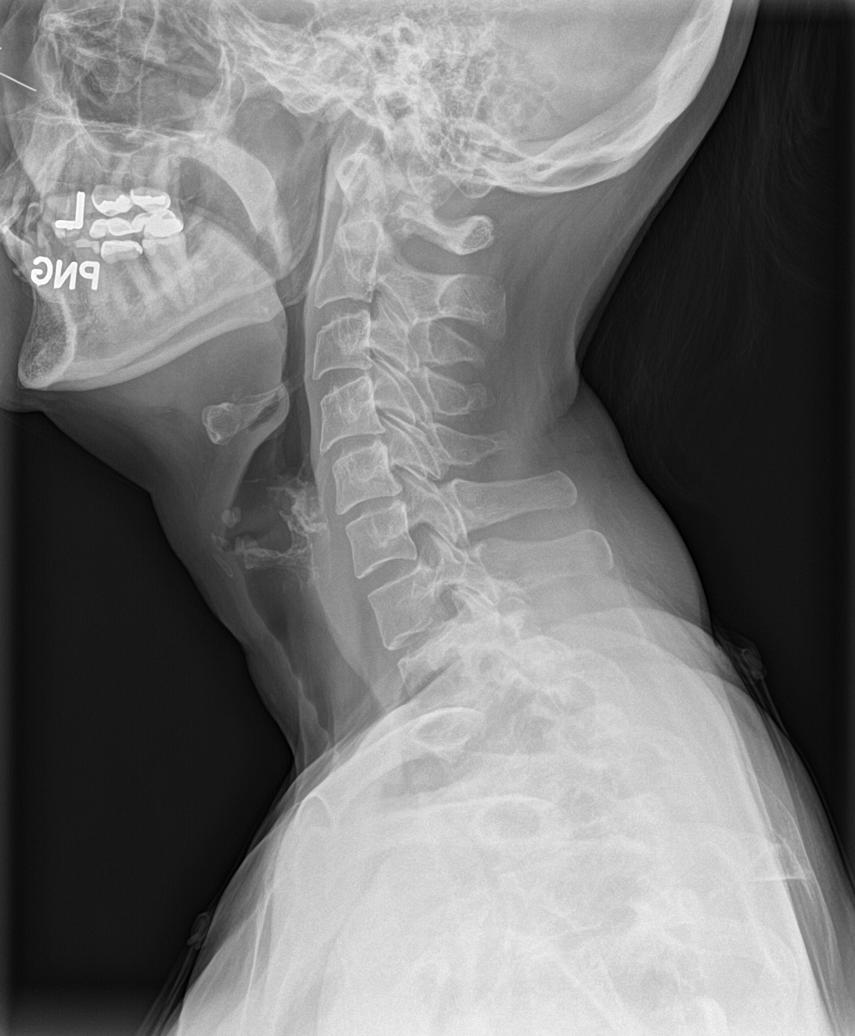

[c-spine obl (1 of 2)]
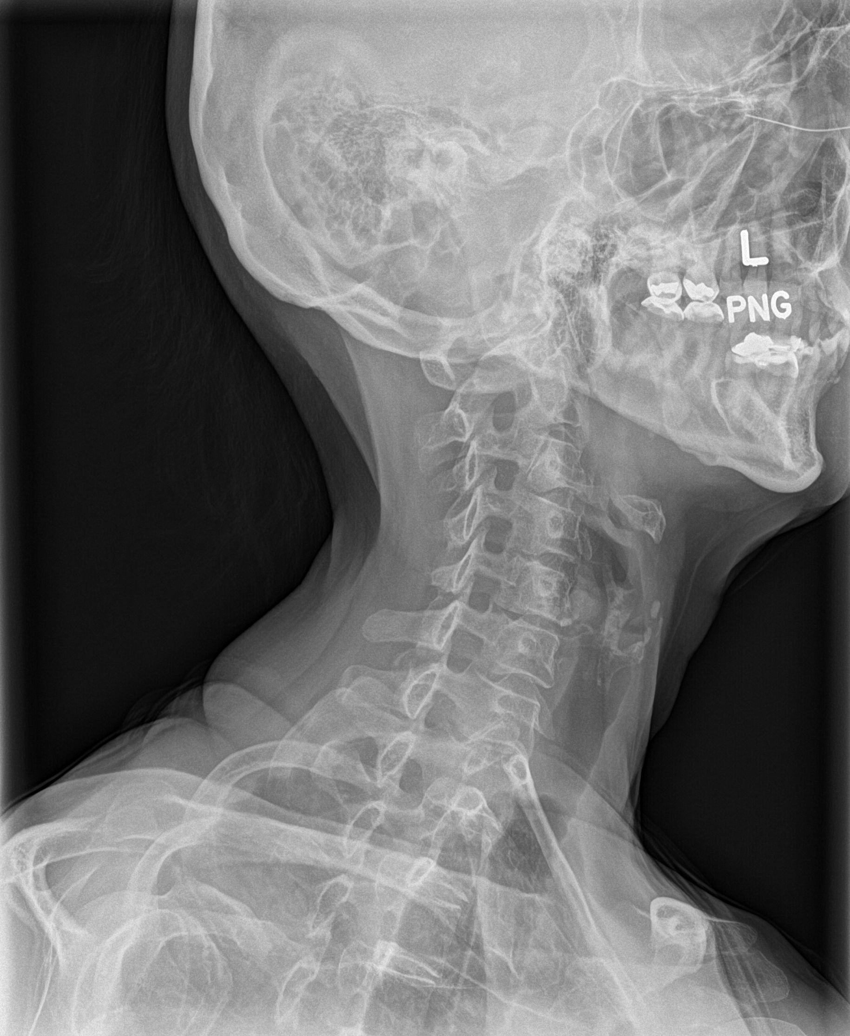

[c-spine obl (2 of 2)]
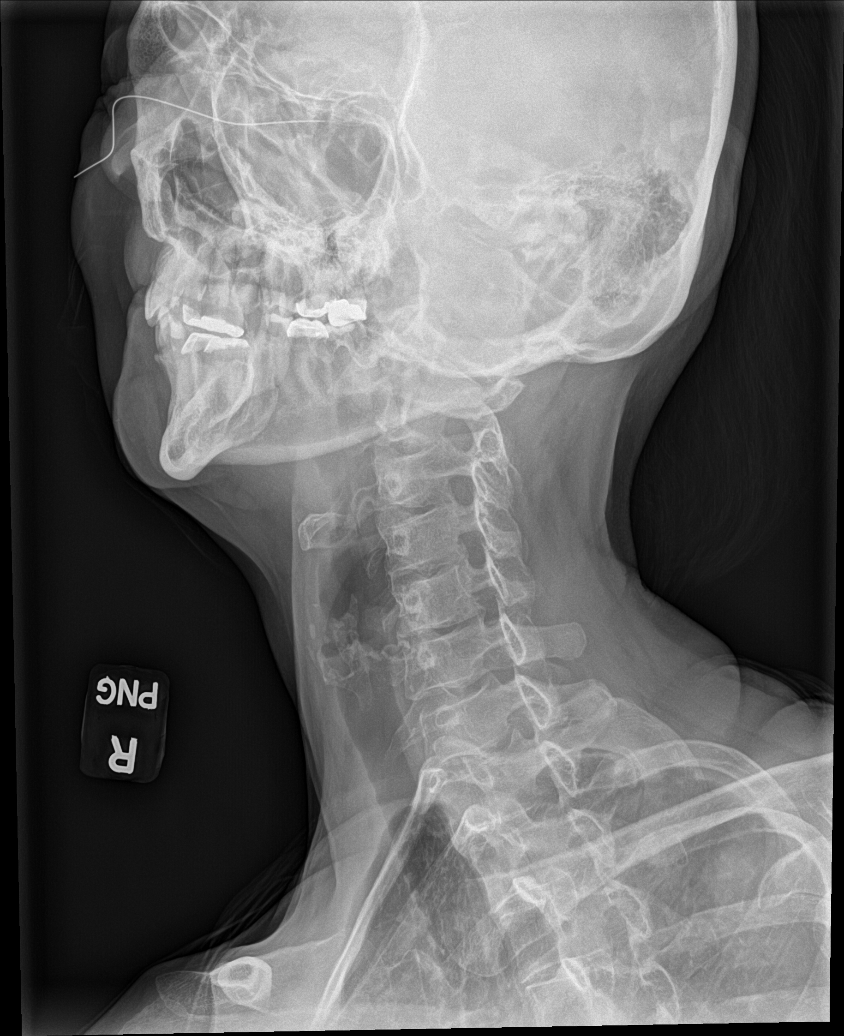

[c-spine ap]
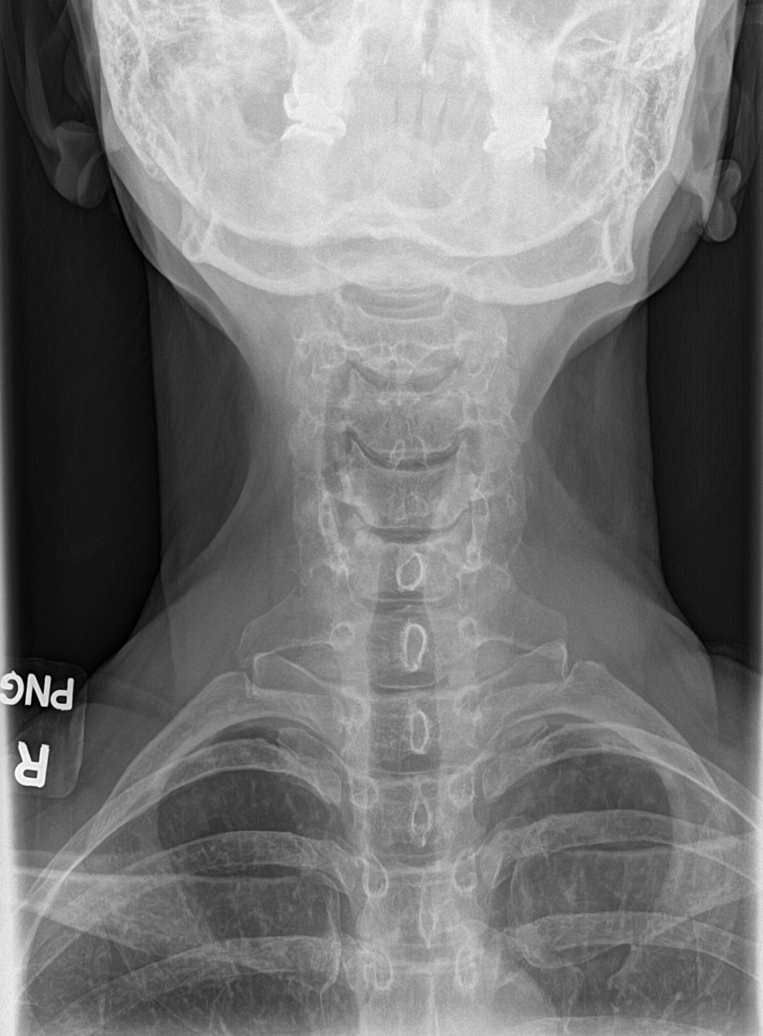

[c-spine open mouth]
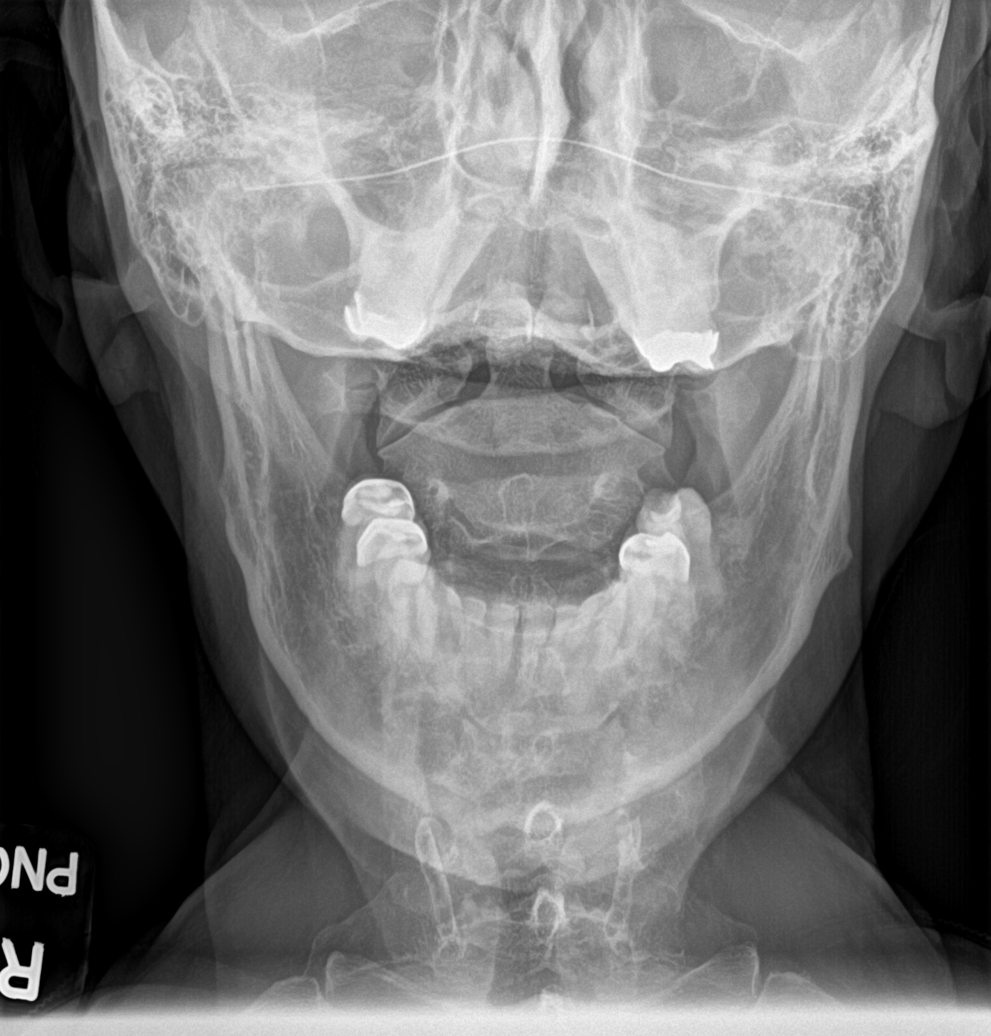

[5 of 5 positions shown; findings below may reference images not displayed]

FINDINGS: Seven cervical type vertebral bodies visualized on the lateral view.
There is no evidence of cervical spine fracture or prevertebral soft
tissue swelling. Alignment is normal. Mild disc space narrowing and
facet hypertrophy. Dental hardware.
IMPRESSION: Mild cervical spondylosis. No acute findings.

## 2022-08-06 ENCOUNTER — Telehealth: Payer: Self-pay | Admitting: Physical Therapy

## 2022-08-06 NOTE — Telephone Encounter (Signed)
Melinda Suarez was referred to OPPT for bil wrist injury with referral placed on 04/23/22 by provider Bo Merino, NP from [facility].  PT sent two plans of care dated 04/29/22 and 06/18/22 multiple times without signed return by referring provider.  PT office received a call from Flo Shanks at [facility] on 08/05/22 stating that the provider no longer works at their facility.  No signature on plans of care will therefore be able to be attained.    Joquan Lotz, PT 08/06/22 1:01 PM

## 2022-08-13 ENCOUNTER — Encounter: Payer: Self-pay | Admitting: *Deleted

## 2022-09-02 DIAGNOSIS — N952 Postmenopausal atrophic vaginitis: Secondary | ICD-10-CM | POA: Diagnosis not present

## 2022-09-02 DIAGNOSIS — Z124 Encounter for screening for malignant neoplasm of cervix: Secondary | ICD-10-CM | POA: Diagnosis not present

## 2022-09-02 DIAGNOSIS — M8580 Other specified disorders of bone density and structure, unspecified site: Secondary | ICD-10-CM | POA: Diagnosis not present

## 2022-09-02 DIAGNOSIS — Z01419 Encounter for gynecological examination (general) (routine) without abnormal findings: Secondary | ICD-10-CM | POA: Diagnosis not present

## 2022-09-02 DIAGNOSIS — Z681 Body mass index (BMI) 19 or less, adult: Secondary | ICD-10-CM | POA: Diagnosis not present

## 2022-09-02 DIAGNOSIS — Z01411 Encounter for gynecological examination (general) (routine) with abnormal findings: Secondary | ICD-10-CM | POA: Diagnosis not present

## 2022-10-08 DIAGNOSIS — Z780 Asymptomatic menopausal state: Secondary | ICD-10-CM | POA: Diagnosis not present

## 2022-10-08 DIAGNOSIS — M8589 Other specified disorders of bone density and structure, multiple sites: Secondary | ICD-10-CM | POA: Diagnosis not present

## 2022-10-08 DIAGNOSIS — M810 Age-related osteoporosis without current pathological fracture: Secondary | ICD-10-CM | POA: Diagnosis not present

## 2022-10-08 LAB — HM DEXA SCAN

## 2022-10-22 DIAGNOSIS — M810 Age-related osteoporosis without current pathological fracture: Secondary | ICD-10-CM | POA: Diagnosis not present

## 2022-12-01 DIAGNOSIS — H401232 Low-tension glaucoma, bilateral, moderate stage: Secondary | ICD-10-CM | POA: Diagnosis not present

## 2023-01-04 ENCOUNTER — Telehealth: Payer: Self-pay | Admitting: Family Medicine

## 2023-01-04 NOTE — Telephone Encounter (Signed)
Copied from CRM [id number]. Topic: Medicare AWV >> Jan 04, 2023  1:03 PM Gwenith Spitz wrote: Reason for CRM: Called patient to reschedule Medicare Annual Wellness Visit (AWV). Left message for patient to call back and reschedule Medicare Annual Wellness Visit (AWV).  Last date of AWV: 01/17/2023  Please reschedule an appointment at any time with Inetta Fermo, [facility]. Please reschedule AWVS with Inetta Fermo, [facility].  If any questions, please contact me at [phone].  Thank you ,  Gabriel Cirri [facility] AWV TEAM Direct Dial [phone]

## 2023-01-14 ENCOUNTER — Telehealth: Payer: Self-pay | Admitting: Family Medicine

## 2023-01-14 NOTE — Telephone Encounter (Signed)
Contacted Clydie Braun to schedule their annual wellness visit. Appointment made for 02/08/2023.  Gabriel Cirri [facility] AWV TEAM Direct Dial [phone]

## 2023-01-21 ENCOUNTER — Ambulatory Visit (INDEPENDENT_AMBULATORY_CARE_PROVIDER_SITE_OTHER): Payer: Medicare PPO | Admitting: Family Medicine

## 2023-01-21 ENCOUNTER — Encounter: Payer: Self-pay | Admitting: Family Medicine

## 2023-01-21 VITALS — BP 100/60 | HR 67 | Temp 97.0°F | Ht 65.0 in | Wt 110.0 lb

## 2023-01-21 DIAGNOSIS — Z23 Encounter for immunization: Secondary | ICD-10-CM | POA: Diagnosis not present

## 2023-01-21 DIAGNOSIS — M810 Age-related osteoporosis without current pathological fracture: Secondary | ICD-10-CM | POA: Diagnosis not present

## 2023-01-21 DIAGNOSIS — Z0000 Encounter for general adult medical examination without abnormal findings: Secondary | ICD-10-CM | POA: Diagnosis not present

## 2023-01-21 DIAGNOSIS — G5132 Clonic hemifacial spasm, left: Secondary | ICD-10-CM

## 2023-01-21 DIAGNOSIS — E785 Hyperlipidemia, unspecified: Secondary | ICD-10-CM

## 2023-01-21 LAB — CBC WITH DIFFERENTIAL/PLATELET
Basophils Absolute: 0 10*3/uL (ref 0.0–0.1)
Basophils Relative: 1 % (ref 0.0–3.0)
Eosinophils Absolute: 0.3 10*3/uL (ref 0.0–0.7)
Eosinophils Relative: 7.5 % — ABNORMAL HIGH (ref 0.0–5.0)
HCT: 41 % (ref 36.0–46.0)
Hemoglobin: 13.4 g/dL (ref 12.0–15.0)
Lymphocytes Relative: 36.1 % (ref 12.0–46.0)
Lymphs Abs: 1.5 10*3/uL (ref 0.7–4.0)
MCHC: 32.7 g/dL (ref 30.0–36.0)
MCV: 96.9 fl (ref 78.0–100.0)
Monocytes Absolute: 0.5 10*3/uL (ref 0.1–1.0)
Monocytes Relative: 11.5 % (ref 3.0–12.0)
Neutro Abs: 1.8 10*3/uL (ref 1.4–7.7)
Neutrophils Relative %: 43.9 % (ref 43.0–77.0)
Platelets: 347 10*3/uL (ref 150.0–400.0)
RBC: 4.23 Mil/uL (ref 3.87–5.11)
RDW: 13.9 % (ref 11.5–15.5)
WBC: 4.1 10*3/uL (ref 4.0–10.5)

## 2023-01-21 LAB — COMPREHENSIVE METABOLIC PANEL
ALT: 20 U/L (ref 0–35)
AST: 27 U/L (ref 0–37)
Albumin: 4.2 g/dL (ref 3.5–5.2)
Alkaline Phosphatase: 55 U/L (ref 39–117)
BUN: 20 mg/dL (ref 6–23)
CO2: 35 mEq/L — ABNORMAL HIGH (ref 19–32)
Calcium: 9.4 mg/dL (ref 8.4–10.5)
Chloride: 100 mEq/L (ref 96–112)
Creatinine, Ser: 0.76 mg/dL (ref 0.40–1.20)
GFR: 81.19 mL/min (ref >60.00)
Glucose, Bld: 90 mg/dL (ref 70–99)
Potassium: 4.5 mEq/L (ref 3.5–5.1)
Sodium: 141 mEq/L (ref 135–145)
Total Bilirubin: 0.5 mg/dL (ref 0.2–1.2)
Total Protein: 6.9 g/dL (ref 6.0–8.3)

## 2023-01-21 LAB — LIPID PANEL
Cholesterol: 196 mg/dL (ref 0–200)
HDL: 79.8 mg/dL (ref >39.00)
LDL Cholesterol: 102 mg/dL — ABNORMAL HIGH (ref 0–99)
NonHDL: 116.14
Total CHOL/HDL Ratio: 2
Triglycerides: 73 mg/dL (ref 0.0–149.0)
VLDL: 14.6 mg/dL (ref 0.0–40.0)

## 2023-01-21 LAB — VITAMIN D 25 HYDROXY (VIT D DEFICIENCY, FRACTURES): VITD: 59.43 ng/mL (ref 30.00–100.00)

## 2023-01-21 LAB — TSH: TSH: 2.4 u[IU]/mL (ref 0.35–5.50)

## 2023-01-21 NOTE — Patient Instructions (Addendum)
Let us know when you have gotten your TDAP at the pharmacy.  Prevnar 20 today  Please stop by lab before you go If you have mychart- we will send your results within 3 business days of [place] receiving them.  If you do not have mychart- we will call you about results within 5 business days of [place] receiving them.  *please also note that you will see labs on mychart as soon as they post. I will later go in and write notes on them- will say "notes from Dr. Durene Cal"    Recommended follow up: Return in about 1 year (around 01/21/2024) for physical or sooner if needed.Schedule b4 you leave.

## 2023-01-21 NOTE — Progress Notes (Signed)
Phone [phone]   Subjective:  Patient presents today for their annual physical. Chief complaint-noted.   See problem oriented charting- ROS- full  review of systems was completed and negative except for: left facial twitch  The following were reviewed and entered/updated in epic: Past Medical History:  Diagnosis Date   Ganglion cyst    Migraine headache    Patient Active Problem List   Diagnosis Date Noted   Hyperlipidemia 07/18/2014    Priority: Medium    Osteoporosis 06/04/2014    Priority: Medium    Migraines 06/04/2014    Priority: Medium    Caregiver stress 08/20/2016    Priority: Low   Glaucoma 06/04/2014    Priority: Low   History of disease of skin and subcutaneous tissue 07/02/2007    Priority: Low   Left shoulder pain 12/05/2020   Other bursal cyst, right hand 04/18/2020   Past Surgical History:  Procedure Laterality Date   BREAST CYST ASPIRATION     benign in 2011   GANGLION CYST EXCISION     left hand; left handed    Family History  Problem Relation Age of Onset   Osteoporosis Mother    Arthritis Mother    Lung cancer Mother        died at 47. still driving and working at church   Kidney disease Father        amylodosis, died in 35s. Tear of aorta and was on blood thinners.    Asthma Brother    Heart disease Paternal Grandfather        15s   Stroke Paternal Grandfather    Diabetes Neg Hx    Hyperlipidemia Neg Hx    Hypertension Neg Hx    Cancer Neg Hx     Medications- reviewed and updated Current Outpatient Medications  Medication Sig Dispense Refill   Calcium Carbonate-Vitamin D (CALCIUM + D PO)       ESTRING 2 MG vaginal ring      latanoprost (XALATAN) 0.005 % ophthalmic solution SMARTSIG:In Eye(s)     Multiple Vitamins-Minerals (MULTIVITAMIN,TX-MINERALS) tablet Take 1 tablet by mouth daily.     SUMAtriptan (IMITREX) 100 MG tablet 1 TABLET FOR MIGRAINE HEADACHE & CAN REPEAT IN 2 HRS IF PERSISTENT MAX OF 2/DAY 8 tablet 11   No  current facility-administered medications for this visit.    Allergies-reviewed and updated Allergies  Allergen Reactions   Depo-Medrol [Methylprednisolone] Hives   Elemental Sulfur    Prednisone Hives   Cephalosporins Rash   Penicillins Rash    Social History   Social History Narrative   Married 1980. 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       Hobbies: hike, walking, former runner, crafts, Archivist, house in [place]. Baking.    Objective  Objective:  BP 100/60   Pulse 67   Temp (!) 97 F (36.1 C)   Ht 5\' 5"  (1.651 m)   Wt 110 lb (49.9 kg)   SpO2 97%   BMI 18.30 kg/m  Gen: NAD, resting comfortably HEENT: Mucous membranes are moist. Oropharynx normal Neck: no thyromegaly CV: RRR no murmurs rubs or gallops Lungs: CTAB no crackles, wheeze, rhonchi Abdomen: soft/nontender/nondistended/normal bowel sounds. No rebound or guarding.  Ext: no edema Skin: warm, dry Neuro: grossly normal, moves all extremities, PERRLA    Assessment and Plan   68 y.o. female presenting for annual physical.  Health Maintenance counseling: 1. Anticipatory guidance: Patient counseled regarding  regular dental exams -q6 months, eye exams -q6 month(s) due to glaucoma,  avoiding smoking and second hand smoke, limiting alcohol to 1 beverage per day- right at one a day , no illicit drugs.   2. Risk factor reduction:  Advised patient of need for regular exercise and diet rich and fruits and vegetables to reduce risk of heart attack and stroke.  Exercise- walking regularly- great for bone density as well.  Diet/weight management-weight largely stable in last year- prefer mild weight gain- night-time been same weight since 2016 essentially at least.  Wt Readings from Last 3 Encounters:  01/21/23 110 lb (49.9 kg)  01/14/22 111 lb 9.6 oz (50.6 kg)  06/11/21 111 lb (50.3 kg)  3. Immunizations/screenings/ancillary studies- Tetanus, Diphtheria, and Pertussis  (Tdap) at pharmacy recommend, prevnar 20 discussed Immunization History  Administered Date(s) Administered   Influenza,inj,Quad PF,6+ Mos 06/13/2015, 09/21/2017, 06/20/2019   Influenza-Unspecified 06/07/2013, 06/30/2016, 08/02/2018, 05/31/2020, 06/28/2021   PFIZER Comirnaty(Gray Top)Covid-19 Tri-Sucrose Vaccine 07/02/2022   PFIZER(Purple Top)SARS-COV-2 Vaccination 11/20/2019, 12/11/2019, 08/01/2020   Pfizer Covid-19 Vaccine Bivalent Booster 47yrs & up 09/12/2021   Td 09/01/2001   Tdap 06/02/2012   Zoster Recombinat (Shingrix) 12/01/2020, 05/15/2021  4. Cervical cancer screening- past age based screening- recommendations for Pap smears.  Last on file 08/06/2017 HPV negative-she reports just had one updated 5. Breast cancer screening-  breast exam with Dr. Algie Coffer and mammogram -04/08/2021 on file but had one in August- we will get copy 6. Colon cancer screening - Cologuard last completed 01/24/22 with 3 year repeat 7. Skin cancer screening- not seeing derm but considering still. advised regular sunscreen use. Denies worrisome, changing, or new skin lesions.  8. Birth control/STD check- postmenopausal and monogamous 9. Osteoporosis screening at 73- see discussion below 10. Smoking associated screening - Never smoker  Status of chronic or acute concerns   # Osteoporosis- fracture August 2023 at work- hairline wrist fracture S: Last DEXA: just had updated exam- 10/07/2020 with GYN-significantly significant increase in bone density in 2 areas and stable and others  Medication (bisphosphonate or prolia): restarted fosamax 3 months ago- had been able to be off for 7 years  Calcium: 1200mg  (through diet ok) recommended -patient takes Caltrate Vitamin D: 1000 units a day recommended-patient takes extra vitamin D-levels have been excellent A/P: osteoporosis on appropriate treatment through gynecology- continue current medications and GYN follow up    #hyperlipidemia S: Medication:None. The 10-year  ASCVD risk score (Arnett DK, et al., 2019) is: 3.7% Lab Results  Component Value Date   CHOL 202 (H) 01/14/2022   HDL 89.90 01/14/2022   LDLCALC 97 01/14/2022   LDLDIRECT 95.7 12/16/2010   TRIG 75.0 01/14/2022   CHOLHDL 2 01/14/2022   A/P: very mild elevations but not in range for medicine- update lipids  #Migraines S: Patient continues to take approximately 10 Imitrex per year -No changes in migraine pattern  A/P: doing well- continue current medications    % Glaucoma-continue twice yearly follow-up with ophthalmology  #left sided facial twitch- ongoing back to 2021. Magnesium normal back then and did CT head largely reassuring. Still under left eye and cheek. Persistent. Had thought in past could be stress related- stress has been much better though and has persisted. Minimal caffeine intake and when had coffee in [place] recently no worsening. Can be every hour -with duration of this and concern for hemifacial spasm we opted to get neurology opinoin to consider botox or MRI or MRA  Recommended follow up: Return in about 1 year (  around 01/21/2024) for physical or sooner if needed.Schedule b4 you leave. Future Appointments  Date Time Provider Department Center  02/08/2023 11:30 AM [facility] ANNUAL WELLNESS VISIT 1 [facility]   Lab/Order associations: fasting   ICD-10-CM   1. Preventative health care  Z00.00     2. Hyperlipidemia, unspecified hyperlipidemia type  E78.5 Comprehensive metabolic panel    CBC with Differential/Platelet    Lipid panel    TSH    3. Hemifacial spasm of left side of face  G51.32 Ambulatory referral to Neurology    4. Age-related osteoporosis without current pathological fracture  M81.0 VITAMIN D 25 Hydroxy (Vit-D Deficiency, Fractures)      No orders of the defined types were placed in this encounter.   Return precautions advised.  Tana Conch, MD

## 2023-01-21 NOTE — Addendum Note (Signed)
Addended by: Gwenette Greet on: 01/21/2023 08:40 AM   Modules accepted: Orders

## 2023-01-28 ENCOUNTER — Encounter: Payer: Self-pay | Admitting: Neurology

## 2023-02-08 ENCOUNTER — Ambulatory Visit (INDEPENDENT_AMBULATORY_CARE_PROVIDER_SITE_OTHER): Payer: Medicare PPO

## 2023-02-08 ENCOUNTER — Other Ambulatory Visit: Payer: Self-pay | Admitting: Family Medicine

## 2023-02-08 VITALS — Wt 110.0 lb

## 2023-02-08 DIAGNOSIS — Z0000 Encounter for general adult medical examination without abnormal findings: Secondary | ICD-10-CM | POA: Diagnosis not present

## 2023-02-08 NOTE — Progress Notes (Signed)
I connected with  Melinda Suarez on 02/08/23 by a audio enabled telemedicine application and verified that I am speaking with the correct person using two identifiers.  Patient Location: Home  Provider Location: Office/Clinic  I discussed the limitations of evaluation and management by telemedicine. The patient expressed understanding and agreed to proceed.   Patient Medicare AWV questionnaire was completed by the patient on 02/05/23; I have confirmed that all information answered by patient is correct and no changes since this date.      Subjective:   Melinda Suarez is a 67 y.o. female who presents for Medicare Annual (Subsequent) preventive examination.  Review of Systems     Cardiac Risk Factors include: advanced age (>71men, >11 women);dyslipidemia     Objective:    Today's Vitals   02/08/23 1133  Weight: 110 lb (49.9 kg)   Body mass index is 18.3 kg/m.     02/08/2023   11:38 AM 04/29/2022   11:51 AM 01/16/2022   11:26 AM  Advanced Directives  Does Patient Have a Medical Advance Directive? Yes No Yes  Type of Estate agent of [place];Living will  Healthcare Power of Attorney  Copy of Healthcare Power of Attorney in Chart? No - copy requested  No - copy requested  Would patient like information on creating a medical advance directive?  No - Patient declined     Current Medications (verified) Outpatient Encounter Medications as of 02/08/2023  Medication Sig   alendronate (FOSAMAX) 70 MG tablet Take 70 mg by mouth once a week.   Calcium Carbonate-Vitamin D (CALCIUM + D PO)     ESTRING 2 MG vaginal ring    latanoprost (XALATAN) 0.005 % ophthalmic solution SMARTSIG:In Eye(s)   Multiple Vitamins-Minerals (MULTIVITAMIN,TX-MINERALS) tablet Take 1 tablet by mouth daily.   SUMAtriptan (IMITREX) 100 MG tablet 1 TABLET FOR MIGRAINE HEADACHE & CAN REPEAT IN 2 HRS IF PERSISTENT MAX OF 2/DAY   No facility-administered encounter medications on file as of  02/08/2023.    Allergies (verified) Depo-medrol [methylprednisolone], Elemental sulfur, Prednisone, Cephalosporins, and Penicillins   History: Past Medical History:  Diagnosis Date   Ganglion cyst    Migraine headache    Past Surgical History:  Procedure Laterality Date   BREAST CYST ASPIRATION     benign in 2011   GANGLION CYST EXCISION     left hand; left handed   Family History  Problem Relation Age of Onset   Osteoporosis Mother    Arthritis Mother    Lung cancer Mother        died at 96. still driving and working at church   Kidney disease Father        amylodosis, died in 87s. Tear of aorta and was on blood thinners.    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 Baking.    Social Determinants of Health   Financial Resource Strain: Low Risk  (02/05/2023)   Overall Financial Resource Strain (CARDIA)    Difficulty of Paying Living Expenses: Not hard at all  Food Insecurity: No Food Insecurity (02/05/2023)   Hunger Vital Sign    Worried About Running Out of  Food in the Last Year: Never true    Ran Out of Food in the Last Year: Never true  Transportation Needs: No Transportation Needs (02/05/2023)   PRAPARE - Administrator, Civil Service (Medical): No    Lack of Transportation (Non-Medical): No  Physical Activity: Sufficiently Active (02/05/2023)   Exercise Vital Sign    Days of Exercise per Week: 7 days    Minutes of Exercise per Session: 30 min  Stress: No Stress Concern Present (02/08/2023)   [employer] of Occupational Health - Occupational Stress Questionnaire    Feeling of Stress : Not at all  Social Connections: Socially Integrated (02/05/2023)   Social Connection and Isolation Panel [NHANES]    Frequency of Communication with Friends and Family: More than three times a week    Frequency of Social Gatherings with Friends and Family: Once a week    Attends Religious Services: More than 4 times per year    Active Member of [employer] or Organizations: Yes    Attends Engineer, structural: More than 4 times per year    Marital Status: Married    Tobacco Counseling Counseling given: Not Answered   Clinical Intake:  Pre-visit preparation completed: Yes  Pain : No/denies pain     BMI - recorded: 18.3 Nutritional Status: BMI <19  Underweight Nutritional Risks: None Diabetes: No  How often do you need to have someone help you when you read instructions, pamphlets, or other written materials from your doctor or pharmacy?: 1 - Never  Diabetic?NO  Interpreter Needed?: No  Information entered by :: Lanier Ensign, LPN   Activities of Daily Living    02/05/2023    4:06 PM  In your present state of health, do you have any difficulty performing the following activities:  Hearing? 0  Vision? 0  Difficulty concentrating or making decisions? 0  Walking or climbing stairs? 0  Dressing or bathing? 0  Doing errands, shopping? 0  Preparing Food and eating ? N  Using the Toilet? N  In the past six months, have  you accidently leaked urine? N  Do you have problems with loss of bowel control? N  Managing your Medications? N  Managing your Finances? N  Housekeeping or managing your Housekeeping? N    Patient Care Team: Shelva Majestic, MD as PCP - General (Family Medicine)  Indicate any recent Medical Services you may have received from other than [facility] providers in the past year (date may be approximate).     Assessment:   This is a routine wellness examination for [place].  Hearing/Vision screen Hearing Screening - Comments:: Pt denies any hearing issues  Vision Screening - Comments:: Pt follows up with Dr Claudette Head for annual eye exams   Dietary issues and exercise activities discussed: Current Exercise Habits: Home exercise routine, Type of exercise: yoga;strength training/weights;stretching;Other - see comments, Time (Minutes): 30, Frequency (Times/Week): 7, Weekly Exercise (Minutes/Week): 210   Goals Addressed             This Visit's Progress    Patient Stated       Keep exercising and  eat right        Depression Screen    02/08/2023   11:37 AM 01/21/2023    8:09 AM 01/16/2022   11:25 AM 01/14/2022    2:49 PM 10/14/2020    8:13 AM 10/05/2019    7:58 AM 10/03/2018    2:55 PM  PHQ 2/9 Scores  PHQ - 2 Score 0 0 0 0 0 0 1  PHQ- 9 Score 0 2  0  1 2    Fall Risk    02/05/2023    4:06 PM 01/21/2023    8:08 AM 01/16/2022   11:27 AM 01/14/2022    2:50 PM 10/14/2020    8:13 AM  Fall Risk   Falls in the past year? 1 1 0 0 0  Number falls in past yr: 0 0 0 0 0  Injury with Fall? 1 1 0 0 0  Comment both wrist in yoga class      Risk for fall due to : History of fall(s);Impaired vision History of fall(s) Impaired vision No Fall Risks   Follow up Falls prevention discussed Falls evaluation completed Falls prevention discussed Falls evaluation completed     FALL RISK PREVENTION PERTAINING TO THE HOME:  Any stairs in or around the home? Yes  If so, are there any without  handrails? No  Home free of loose throw rugs in walkways, pet beds, electrical cords, etc? Yes  Adequate lighting in your home to reduce risk of falls? Yes   ASSISTIVE DEVICES UTILIZED TO PREVENT FALLS:  Life alert? No  Use of a cane, walker or w/c? No  Grab bars in the bathroom? No  Shower chair or bench in shower? No  Elevated toilet seat or a handicapped toilet? No   TIMED UP AND GO:  Was the test performed? No .  Cognitive Function:        01/16/2022   11:28 AM  6CIT Screen  What Year? 0 points  What month? 0 points  What time? 0 points  Count back from 20 0 points  Months in reverse 0 points  Repeat phrase 0 points  Total Score 0 points    Immunizations Immunization History  Administered Date(s) Administered   Influenza,inj,Quad PF,6+ Mos 06/13/2015, 09/21/2017, 06/20/2019   Influenza-Unspecified 06/07/2013, 06/30/2016, 08/02/2018, 05/31/2020, 06/28/2021   PFIZER Comirnaty(Gray Top)Covid-19 Tri-Sucrose Vaccine 07/02/2022   PFIZER(Purple Top)SARS-COV-2 Vaccination 11/20/2019, 12/11/2019, 08/01/2020   PNEUMOCOCCAL CONJUGATE-20 01/21/2023   Pfizer Covid-19 Vaccine Bivalent Booster 64yrs & up 09/12/2021   Td 09/01/2001   Tdap 06/02/2012   Zoster Recombinat (Shingrix) 12/01/2020, 05/15/2021    TDAP status: Up to date  Flu Vaccine status: Up to date  Pneumococcal vaccine status: Up to date  Covid-19 vaccine status: Completed vaccines  Qualifies for Shingles Vaccine? Yes   Zostavax completed Yes   Shingrix Completed?: Yes  Screening Tests Health Maintenance  Topic Date Due   COVID-19 Vaccine (6 - 2023-24 season) 08/27/2022   Hepatitis C Screening  08/27/2098 (Originally 10/28/1973)   INFLUENZA VACCINE  04/01/2023   MAMMOGRAM  04/09/2023   Medicare Annual Wellness (AWV)  02/08/2024   Fecal DNA (Cologuard)  01/24/2025   DEXA SCAN  10/08/2025   Pneumonia Vaccine 62+ Years old  Completed   Zoster Vaccines- Shingrix  Completed   HPV VACCINES  Aged Out    DTaP/Tdap/Td  Discontinued   Colonoscopy  Discontinued    Health Maintenance  Health Maintenance Due  Topic Date Due   COVID-19 Vaccine (6 - 2023-24 season) 08/27/2022    Colorectal cancer screening: Type of screening: Cologuard. Completed 01/24/22. Repeat every 3 years  Mammogram status: Completed 04/08/21. Repeat every year  Bone Density status: Completed 10/08/22. Results reflect: Bone density results: OSTEOPOROSIS. Repeat every 3 years.   Additional Screening:  Hepatitis C Screening: does qualify  Vision Screening: Recommended annual ophthalmology exams for early detection of glaucoma and other disorders of the eye. Is the patient up to date with their annual eye exam?  Yes  Who is the provider or what is the name of the office in which the patient attends annual eye exams? Dr Burgess Estelle  If pt is not established with a provider, would they like to be referred to a provider to establish care? No .   Dental Screening: Recommended annual dental exams for proper oral hygiene  Community Resource Referral / Chronic Care Management: CRR required this visit?  No   CCM required this visit?  No      Plan:     I have personally reviewed and noted the following in the patient's chart:   Medical and social history Use of alcohol, tobacco or illicit drugs  Current medications and supplements including opioid prescriptions. Patient is not currently taking opioid prescriptions. Functional ability and status Nutritional status Physical activity Advanced directives List of other physicians Hospitalizations, surgeries, and ER visits in previous 12 months Vitals Screenings to include cognitive, depression, and falls Referrals and appointments  In addition, I have reviewed and discussed with patient certain preventive protocols, quality metrics, and best practice recommendations. A written personalized care plan for preventive services as well as general preventive health recommendations  were provided to patient.     Marzella Schlein, LPN   [id number]/61/0960   Nurse Notes: none

## 2023-02-08 NOTE — Patient Instructions (Signed)
Melinda Suarez , Thank you for taking time to come for your Medicare Wellness Visit. I appreciate your ongoing commitment to your health goals. Please review the following plan we discussed and let me know if I can assist you in the future.   These are the goals we discussed:  Goals      Patient Stated     Eat right and exercise more      Patient Stated     Keep exercising and  eat right         This is a list of the screening recommended for you and due dates:  Health Maintenance  Topic Date Due   COVID-19 Vaccine (6 - 2023-24 season) 08/27/2022   Hepatitis C Screening  08/27/2098*   Flu Shot  04/01/2023   Mammogram  04/09/2023   Medicare Annual Wellness Visit  02/08/2024   Cologuard (Stool DNA test)  01/24/2025   DEXA scan (bone density measurement)  10/08/2025   Pneumonia Vaccine  Completed   Zoster (Shingles) Vaccine  Completed   HPV Vaccine  Aged Out   DTaP/Tdap/Td vaccine  Discontinued   Colon Cancer Screening  Discontinued  *Topic was postponed. The date shown is not the original due date.    Advanced directives: Please bring a copy of your health care power of attorney and living will to the office at your convenience.  Conditions/risks identified: stay active and eat right   Next appointment: Follow up in one year for your annual wellness visit    Preventive Care 65 Years and Older, Female Preventive care refers to lifestyle choices and visits with your health care provider that can promote health and wellness. What does preventive care include? A yearly physical exam. This is also called an annual well check. Dental exams once or twice a year. Routine eye exams. Ask your health care provider how often you should have your eyes checked. Personal lifestyle choices, including: Daily care of your teeth and gums. Regular physical activity. Eating a healthy diet. Avoiding tobacco and drug use. Limiting alcohol use. Practicing safe sex. Taking low-dose aspirin  every day. Taking vitamin and mineral supplements as recommended by your health care provider. What happens during an annual well check? The services and screenings done by your health care provider during your annual well check will depend on your age, overall health, lifestyle risk factors, and family history of disease. Counseling  Your health care provider may ask you questions about your: Alcohol use. Tobacco use. Drug use. Emotional well-being. Home and relationship well-being. Sexual activity. Eating habits. History of falls. Memory and ability to understand (cognition). Work and work Astronomer. Reproductive health. Screening  You may have the following tests or measurements: Height, weight, and BMI. Blood pressure. Lipid and cholesterol levels. These may be checked every 5 years, or more frequently if you are over 16 years old. Skin check. Lung cancer screening. You may have this screening every year starting at age 48 if you have a 30-pack-year history of smoking and currently smoke or have quit within the past 15 years. Fecal occult blood test (FOBT) of the stool. You may have this test every year starting at age 17. Flexible sigmoidoscopy or colonoscopy. You may have a sigmoidoscopy every 5 years or a colonoscopy every 10 years starting at age 35. Hepatitis C blood test. Hepatitis B blood test. Sexually transmitted disease (STD) testing. Diabetes screening. This is done by checking your blood sugar (glucose) after you have not eaten for a while (  fasting). You may have this done every 1-3 years. Bone density scan. This is done to screen for osteoporosis. You may have this done starting at age 67. Mammogram. This may be done every 1-2 years. Talk to your health care provider about how often you should have regular mammograms. Talk with your health care provider about your test results, treatment options, and if necessary, the need for more tests. Vaccines  Your health  care provider may recommend certain vaccines, such as: Influenza vaccine. This is recommended every year. Tetanus, diphtheria, and acellular pertussis (Tdap, Td) vaccine. You may need a Td booster every 67 years. Zoster vaccine. You may need this after age 67. Pneumococcal 13-valent conjugate (PCV13) vaccine. One dose is recommended after age 67. Pneumococcal polysaccharide (PPSV23) vaccine. One dose is recommended after age 67. Talk to your health care provider about which screenings and vaccines you need and how often you need them. This information is not intended to replace advice given to you by your health care provider. Make sure you discuss any questions you have with your health care provider. Document Released: 09/13/2015 Document Revised: 05/06/2016 Document Reviewed: 06/18/2015 Elsevier Interactive Patient Education  2017 [employer].  Fall Prevention in the Home Falls can cause injuries. They can happen to people of all ages. There are many things you can do to make your home safe and to help prevent falls. What can I do on the outside of my home? Regularly fix the edges of walkways and driveways and fix any cracks. Remove anything that might make you trip as you walk through a door, such as a raised step or threshold. Trim any bushes or trees on the path to your home. Use bright outdoor lighting. Clear any walking paths of anything that might make someone trip, such as rocks or tools. Regularly check to see if handrails are loose or broken. Make sure that both sides of any steps have handrails. Any raised decks and porches should have guardrails on the edges. Have any leaves, snow, or ice cleared regularly. Use sand or salt on walking paths during winter. Clean up any spills in your garage right away. This includes oil or grease spills. What can I do in the bathroom? Use night lights. Install grab bars by the toilet and in the tub and shower. Do not use towel bars as grab  bars. Use non-skid mats or decals in the tub or shower. If you need to sit down in the shower, use a plastic, non-slip stool. Keep the floor dry. Clean up any water that spills on the floor as soon as it happens. Remove soap buildup in the tub or shower regularly. Attach bath mats securely with double-sided non-slip rug tape. Do not have throw rugs and other things on the floor that can make you trip. What can I do in the bedroom? Use night lights. Make sure that you have a light by your bed that is easy to reach. Do not use any sheets or blankets that are too big for your bed. They should not hang down onto the floor. Have a firm chair that has side arms. You can use this for support while you get dressed. Do not have throw rugs and other things on the floor that can make you trip. What can I do in the kitchen? Clean up any spills right away. Avoid walking on wet floors. Keep items that you use a lot in easy-to-reach places. If you need to reach something above you, use  a strong step stool that has a grab bar. Keep electrical cords out of the way. Do not use floor polish or wax that makes floors slippery. If you must use wax, use non-skid floor wax. Do not have throw rugs and other things on the floor that can make you trip. What can I do with my stairs? Do not leave any items on the stairs. Make sure that there are handrails on both sides of the stairs and use them. Fix handrails that are broken or loose. Make sure that handrails are as long as the stairways. Check any carpeting to make sure that it is firmly attached to the stairs. Fix any carpet that is loose or worn. Avoid having throw rugs at the top or bottom of the stairs. If you do have throw rugs, attach them to the floor with carpet tape. Make sure that you have a light switch at the top of the stairs and the bottom of the stairs. If you do not have them, ask someone to add them for you. What else can I do to help prevent  falls? Wear shoes that: Do not have high heels. Have rubber bottoms. Are comfortable and fit you well. Are closed at the toe. Do not wear sandals. If you use a stepladder: Make sure that it is fully opened. Do not climb a closed stepladder. Make sure that both sides of the stepladder are locked into place. Ask someone to hold it for you, if possible. Clearly mark and make sure that you can see: Any grab bars or handrails. First and last steps. Where the edge of each step is. Use tools that help you move around (mobility aids) if they are needed. These include: Canes. Walkers. Scooters. Crutches. Turn on the lights when you go into a dark area. Replace any light bulbs as soon as they burn out. Set up your furniture so you have a clear path. Avoid moving your furniture around. If any of your floors are uneven, fix them. If there are any pets around you, be aware of where they are. Review your medicines with your doctor. Some medicines can make you feel dizzy. This can increase your chance of falling. Ask your doctor what other things that you can do to help prevent falls. This information is not intended to replace advice given to you by your health care provider. Make sure you discuss any questions you have with your health care provider. Document Released: 06/13/2009 Document Revised: 01/23/2016 Document Reviewed: 09/21/2014 Elsevier Interactive Patient Education  2017 [employer].

## 2023-03-09 ENCOUNTER — Ambulatory Visit: Payer: Medicare PPO | Admitting: Neurology

## 2023-03-11 NOTE — Progress Notes (Signed)
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  However, it is also possible that she has hemifacial spasm on the left, which can produce facial droop.  However, usually if this is the case, one has very significant hemifacial spasm, and this really is not the case in her.  -I recommended MRI brain and she was agreeable.  -We talked about Botox along with its risks and benefits.  I discussed with her that this certainly could make facial droop and asymmetry worse.  It would certainly help the facial spasm.  She is not sure she wants to proceed with that, but states that she will think about it.    Subjective:   Melinda Suarez was seen today in neurologic consultation at the request of Shelva Majestic, MD.  The consultation is for the evaluation of facial twitching on the L since 2021.  Outside records that were made available to me were reviewed.  Pt reports it has been going on intermittently since 2019.  When it started in 2019, it was just the eye and she does think that it was related to stress.  She states that in 2021, it started in the L eye and L cheek.  She has noted facial droop and she thought that it was from the "contractions with time."  She doesn't know if the facial droop has gotten better or worse with time.     Neuroimaging has previously been performed.  Had a CT brain back in August, 2021 that was unremarkable.  She has not had an MRI brain.   ALLERGIES:   Allergies  Allergen Reactions   Depo-Medrol [Methylprednisolone] Hives   Elemental Sulfur    Prednisone Hives   Cephalosporins Rash   Penicillins Rash    CURRENT MEDICATIONS:  Outpatient Encounter Medications as of 03/15/2023  Medication Sig   alendronate (FOSAMAX) 70 MG tablet Take 70 mg by mouth once a week.   Calcium Carbonate-Vitamin D (CALCIUM + D PO)     ESTRING 2  MG vaginal ring    latanoprost (XALATAN) 0.005 % ophthalmic solution SMARTSIG:In Eye(s)   Multiple Vitamins-Minerals (MULTIVITAMIN,TX-MINERALS) tablet Take 1 tablet by mouth daily.   SUMAtriptan (IMITREX) 100 MG tablet TAKE ONE TABLET BY MOUTH FOR migraine HEADACHE, MAY REPEAT in TWO hours if persistent. max 2/day   No facility-administered encounter medications on file as of 03/15/2023.    Objective:   PHYSICAL EXAMINATION:    VITALS:   Vitals:   03/15/23 1425  BP: 110/80  Pulse: 72  SpO2: 96%  Weight: 114 lb 6.4 oz (51.9 kg)  Height: 5' 5.5" (1.664 m)    GEN:  Normal appears female in no acute distress.  Appears stated age. HEENT:  Normocephalic, atraumatic. The mucous membranes are moist. The superficial temporal arteries are without ropiness or tenderness. Cardiovascular: Regular rate and rhythm. Lungs: Clear to auscultation bilaterally. Neck/Heme: There are no carotid bruits noted bilaterally.  NEUROLOGICAL: Orientation:  The patient is alert and oriented x 3.   Cranial nerves: There is L facial droop.  She is able to activate the mm of facial expression on the L but less so than the R.   Forehead muscles are symmetric.  Extraocular muscles are intact and visual fields are full to confrontational testing. Speech is fluent and clear. Soft palate rises symmetrically and there is no tongue deviation. Hearing is intact  to conversational tone. Tone: Tone is good throughout. Sensation: Sensation is intact to light touch and pinprick throughout (facial, trunk, extremities). Vibration is intact at the bilateral big toe. There is no extinction with double simultaneous stimulation. There is no sensory dermatomal level identified. Coordination:  The patient has no difficulty with RAM's or FNF bilaterally. Motor: Strength is 5/5 in the bilateral upper and lower extremities.  Shoulder shrug is equal and symmetric. There is no pronator drift.  There are no fasciculations noted. DTR's: Deep  tendon reflexes are 2+-3/4 at the bilateral biceps, triceps, brachioradialis, patella and achilles.  Plantar responses are downgoing bilaterally. Gait and Station: The patient is able to ambulate without difficulty. The patient is able to heel toe walk without any difficulty. The patient is able to ambulate in a tandem fashion. The patient is able to stand in the Romberg position.    Total time spent on today's visit was 45 minutes, including both face-to-face time and nonface-to-face time.  Time included that spent on review of records (prior notes available to me/labs/imaging if pertinent), discussing treatment and goals, answering patient's questions and coordinating care.   Cc:  Shelva Majestic, MD

## 2023-03-15 ENCOUNTER — Encounter: Payer: Self-pay | Admitting: Neurology

## 2023-03-15 ENCOUNTER — Ambulatory Visit: Payer: Medicare PPO | Admitting: Neurology

## 2023-03-15 VITALS — BP 110/80 | HR 72 | Ht 65.5 in | Wt 114.4 lb

## 2023-03-15 DIAGNOSIS — R29810 Facial weakness: Secondary | ICD-10-CM | POA: Diagnosis not present

## 2023-03-15 DIAGNOSIS — G5139 Clonic hemifacial spasm, unspecified: Secondary | ICD-10-CM | POA: Diagnosis not present

## 2023-03-15 NOTE — Patient Instructions (Signed)
A referral to [facility] has been placed for your MRI someone will contact you directly to schedule your appt. They are located at [place]. Please contact them directly by calling [phone] with any questions regarding your referral.  The physicians and staff at [facility] are committed to providing excellent care. You may receive a survey requesting feedback about your experience at our office. We strive to receive "very good" responses to the survey questions. If you feel that your experience would prevent you from giving the office a "very good " response, please contact our office to try to remedy the situation. We may be reached at [phone]. Thank you for taking the time out of your busy day to complete the survey.

## 2023-03-22 ENCOUNTER — Ambulatory Visit
Admission: RE | Admit: 2023-03-22 | Discharge: 2023-03-22 | Disposition: A | Discharge status: Home / Self Care | Payer: Medicare PPO | Source: Ambulatory Visit | Attending: Neurology | Admitting: Neurology

## 2023-03-22 DIAGNOSIS — R29810 Facial weakness: Secondary | ICD-10-CM | POA: Diagnosis not present

## 2023-03-22 DIAGNOSIS — G5139 Clonic hemifacial spasm, unspecified: Secondary | ICD-10-CM

## 2023-03-22 MED ORDER — GADOPICLENOL 0.5 MMOL/ML IV SOLN
5.0000 mL | Freq: Once | INTRAVENOUS | Status: AC | PRN
  Administered 2023-03-22: 5 mL via INTRAVENOUS

## 2023-04-21 DIAGNOSIS — Z1231 Encounter for screening mammogram for malignant neoplasm of breast: Secondary | ICD-10-CM | POA: Diagnosis not present

## 2023-04-21 LAB — HM MAMMOGRAPHY

## 2023-06-01 DIAGNOSIS — H43813 Vitreous degeneration, bilateral: Secondary | ICD-10-CM | POA: Diagnosis not present

## 2023-06-01 DIAGNOSIS — H524 Presbyopia: Secondary | ICD-10-CM | POA: Diagnosis not present

## 2023-06-01 DIAGNOSIS — H52203 Unspecified astigmatism, bilateral: Secondary | ICD-10-CM | POA: Diagnosis not present

## 2023-06-01 DIAGNOSIS — H401232 Low-tension glaucoma, bilateral, moderate stage: Secondary | ICD-10-CM | POA: Diagnosis not present

## 2023-06-01 DIAGNOSIS — H26493 Other secondary cataract, bilateral: Secondary | ICD-10-CM | POA: Diagnosis not present

## 2023-06-01 DIAGNOSIS — H04123 Dry eye syndrome of bilateral lacrimal glands: Secondary | ICD-10-CM | POA: Diagnosis not present

## 2023-06-29 DIAGNOSIS — H26492 Other secondary cataract, left eye: Secondary | ICD-10-CM | POA: Diagnosis not present

## 2023-08-10 DIAGNOSIS — H26491 Other secondary cataract, right eye: Secondary | ICD-10-CM | POA: Diagnosis not present

## 2023-09-08 DIAGNOSIS — Z01411 Encounter for gynecological examination (general) (routine) with abnormal findings: Secondary | ICD-10-CM | POA: Diagnosis not present

## 2023-09-08 DIAGNOSIS — Z124 Encounter for screening for malignant neoplasm of cervix: Secondary | ICD-10-CM | POA: Diagnosis not present

## 2023-09-08 DIAGNOSIS — M810 Age-related osteoporosis without current pathological fracture: Secondary | ICD-10-CM | POA: Diagnosis not present

## 2023-09-08 DIAGNOSIS — N952 Postmenopausal atrophic vaginitis: Secondary | ICD-10-CM | POA: Diagnosis not present

## 2023-09-08 DIAGNOSIS — Z01419 Encounter for gynecological examination (general) (routine) without abnormal findings: Secondary | ICD-10-CM | POA: Diagnosis not present

## 2023-09-08 DIAGNOSIS — Z1331 Encounter for screening for depression: Secondary | ICD-10-CM | POA: Diagnosis not present

## 2024-01-14 ENCOUNTER — Ambulatory Visit: Admitting: Sports Medicine

## 2024-01-26 DIAGNOSIS — H401232 Low-tension glaucoma, bilateral, moderate stage: Secondary | ICD-10-CM | POA: Diagnosis not present

## 2024-02-11 NOTE — Progress Notes (Unsigned)
 [facility] [address] [place] [address] Phone: [phone] Subjective:   IBryan Caprio, am serving as a scribe for Dr. Ronnell Coins.  I'm seeing this patient by the request  of:  Almira Jaeger, MD  CC: Left hand pain  [id number]  06/11/2021 Left shoulder is making significant progress. On ultrasound today no significant hypoechoic changes noted. Patient is increasing activity. Patient has improved in overall range of motion. Patient will have a follow-up in 2 to 3 months but if perfect patient can follow-up as needed.   Updated 02/14/2024 Melinda Suarez is a 68 y.o. female coming in with complaint of L hand pain. Pinky on L hand. No pain, but tingling and swelling.      Past Medical History:  Diagnosis Date   Ganglion cyst    Migraine headache    Osteopenia    Past Surgical History:  Procedure Laterality Date   BREAST CYST ASPIRATION     benign in 2011   GANGLION CYST EXCISION     left hand; left handed   Social History   Socioeconomic History   Marital status: Married    Spouse name: Gaylin Ke   Number of children: 1   Years of education: 16   Highest education level: Not on file  Occupational History   Occupation: IT trainer    Comment: work for Aud[employer] firm  Tobacco Use   Smoking status: Never   Smokeless tobacco: Never  Vaping Use   Vaping status: Never Used  Substance and Sexual Activity   Alcohol use: Yes    Alcohol/week: 7.0 standard drinks of alcohol    Types: 7 Standard drinks or equivalent per week    Comment: 1 glass wine/night   Drug use: No   Sexual activity: Yes    Partners: Male  Other Topics Concern   Not on file  Social History Narrative   Married 1980. 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       Hobbies: hike, walking, former runner, crafts, Archivist, house in [place]. Baking.    Left handed       Lives with husband/2025   Social Drivers of  Health   Financial Resource Strain: Low Risk  (02/14/2024)   Overall Financial Resource Strain (CARDIA)    Difficulty of Paying Living Expenses: Not hard at all  Food Insecurity: No Food Insecurity (02/14/2024)   Hunger Vital Sign    Worried About Running Out of Food in the Last Year: Never true    Ran Out of Food in the Last Year: Never true  Transportation Needs: No Transportation Needs (02/14/2024)   PRAPARE - Administrator, Civil Service (Medical): No    Lack of Transportation (Non-Medical): No  Physical Activity: Sufficiently Active (02/14/2024)   Exercise Vital Sign    Days of Exercise per Week: 7 days    Minutes of Exercise per Session: 30 min  Stress: No Stress Concern Present (02/14/2024)   [employer] of Occupational Health - Occupational Stress Questionnaire    Feeling of Stress: Not at all  Social Connections: Socially Integrated (02/14/2024)   Social Connection and Isolation Panel    Frequency of Communication with Friends and Family: More than three times a week    Frequency of Social Gatherings with Friends and Family: Once a week    Attends Religious Services: More than 4 times per year    Active Member  of Clubs or Organizations: Yes    Attends Banker Meetings: Never    Marital Status: Married   Allergies  Allergen Reactions   Depo-Medrol [Methylprednisolone] Hives   Elemental Sulfur    Prednisone Hives   Cephalosporins Rash   Penicillins Rash   Family History  Problem Relation Age of Onset   Osteoporosis Mother    Arthritis Mother    Lung cancer Mother        died at 75. still driving and working at church   Kidney disease Father        amylodosis, died in 71s. Tear of aorta and was on blood thinners.    Asthma Brother    Heart disease Paternal Grandfather        29s   Stroke Paternal Grandfather    Diabetes Neg Hx    Hyperlipidemia Neg Hx    Hypertension Neg Hx    Cancer Neg Hx     Current Outpatient Medications  (Endocrine & Metabolic):    alendronate (FOSAMAX) 70 MG tablet, Take 70 mg by mouth once a week.    Current Outpatient Medications (Analgesics):    SUMAtriptan  (IMITREX ) 100 MG tablet, TAKE ONE TABLET BY MOUTH FOR migraine HEADACHE, MAY REPEAT in TWO hours if persistent. max 2/day   Current Outpatient Medications (Other):    Calcium Carbonate-Vitamin D  (CALCIUM + D PO),     ESTRING 2 MG vaginal ring,    latanoprost (XALATAN) 0.005 % ophthalmic solution, SMARTSIG:In Eye(s)   Multiple Vitamins-Minerals (MULTIVITAMIN,TX-MINERALS) tablet, Take 1 tablet by mouth daily.   Reviewed prior external information including notes and imaging from  primary care provider As well as notes that were available from care everywhere and other healthcare systems.  Past medical history, social, surgical and family history all reviewed in electronic medical record.  No pertanent information unless stated regarding to the chief complaint.   Review of Systems:  No headache, visual changes, nausea, vomiting, diarrhea, constipation, dizziness, abdominal pain, skin rash, fevers, chills, night sweats, weight loss, swollen lymph nodes, body aches, joint swelling, chest pain, shortness of breath, mood changes. POSITIVE muscle aches  Objective  Blood pressure 104/64, pulse 85, height 5' 5 (1.651 m), weight 114 lb (51.7 kg), SpO2 97%.   General: No apparent distress alert and oriented x3 mood and affect normal, dressed appropriately.  HEENT: Pupils equal, extraocular movements intact  Respiratory: Patient's speak in full sentences and does not appear short of breath  Cardiovascular: No lower extremity edema, non tender, no erythema  Left hand does show some thickening noted on the ulnar aspect between the PIP and the MCP of the fifth finger.  No skin breakdown noted.  Good range of motion.  When pushing this area of inflammation   Limited muscular skeletal ultrasound was performed and interpreted by Ronnell Coins, M  Limited ultrasound shows some hypoechoic changes that actually is more of a heterotropic mass noted.  No abnormal blood flow.  Seems to be more fibroma like.  When compressed with the ultrasound causes the tingling.  An area of MCP does have some overlying calcific changes in the soft tissue.  Otherwise fairly unremarkable. Impression: Questionable fibroma   Impression and Recommendations:    The above documentation has been reviewed and is accurate and complete Roxy Filler M Suvan Stcyr, DO

## 2024-02-14 ENCOUNTER — Ambulatory Visit (INDEPENDENT_AMBULATORY_CARE_PROVIDER_SITE_OTHER)

## 2024-02-14 ENCOUNTER — Encounter: Payer: Self-pay | Admitting: Family Medicine

## 2024-02-14 ENCOUNTER — Other Ambulatory Visit: Payer: Self-pay

## 2024-02-14 ENCOUNTER — Ambulatory Visit (INDEPENDENT_AMBULATORY_CARE_PROVIDER_SITE_OTHER): Payer: Medicare PPO | Admitting: Family Medicine

## 2024-02-14 ENCOUNTER — Ambulatory Visit: Admitting: Family Medicine

## 2024-02-14 VITALS — BP 104/64 | HR 85 | Ht 65.0 in | Wt 114.0 lb

## 2024-02-14 DIAGNOSIS — Z0000 Encounter for general adult medical examination without abnormal findings: Secondary | ICD-10-CM | POA: Diagnosis not present

## 2024-02-14 DIAGNOSIS — M7989 Other specified soft tissue disorders: Secondary | ICD-10-CM | POA: Diagnosis not present

## 2024-02-14 DIAGNOSIS — M79645 Pain in left finger(s): Secondary | ICD-10-CM

## 2024-02-14 DIAGNOSIS — D2112 Benign neoplasm of connective and other soft tissue of left upper limb, including shoulder: Secondary | ICD-10-CM | POA: Diagnosis not present

## 2024-02-14 DIAGNOSIS — M79642 Pain in left hand: Secondary | ICD-10-CM | POA: Diagnosis not present

## 2024-02-14 DIAGNOSIS — M1812 Unilateral primary osteoarthritis of first carpometacarpal joint, left hand: Secondary | ICD-10-CM | POA: Diagnosis not present

## 2024-02-14 NOTE — Progress Notes (Signed)
 I have reviewed and agree with note, evaluation, plan.  Brendell ecommended Tdap-reaching out to make sure this was recommended at the pharmacy as coverage would be better  Clarisa Crooked, MD

## 2024-02-14 NOTE — Assessment & Plan Note (Addendum)
 Discussed HEP  Likely fibroma between the PIP and the MCP on the fifth finger, brace given, no abnormal blood flow noted.  We discussed warm compresses and home exercises, icing regimen, increase activity slowly.  If worsening pain would consider injections.  Could get x-rays to further evaluate for any bony abnormality.  Mild calcific changes noted at the MCP joint. Will continue to monitor and will follow-up again in 2 months to make sure no significant enlargement occurs.  Could consider potential injection.

## 2024-02-14 NOTE — Patient Instructions (Signed)
 Hand xray Frog splint at night Heat and massage 10 min a day Check back in 2 months

## 2024-02-14 NOTE — Patient Instructions (Signed)
 Ms. Melinda Suarez , Thank you for taking time out of your busy schedule to complete your Annual Wellness Visit with me. I enjoyed our conversation and look forward to speaking with you again next year. I, as well as your care team,  appreciate your ongoing commitment to your health goals. Please review the following plan we discussed and let me know if I can assist you in the future. Your Game plan/ To Do List    Follow up Visits: Next Medicare AWV with our clinical staff: N/A   Have you seen your provider in the last 6 months (3 months if uncontrolled diabetes)? No Next Office Visit with your provider: Patient is scheduled to see Dr. Arlene Ben in September 2025.  Clinician Recommendations:  Aim for 30 minutes of exercise or brisk walking, 6-8 glasses of water, and 5 servings of fruits and vegetables each day. You are due for a Tetanus vaccine and can get that done at your local pharmacy.      This is a list of the screening recommended for you and due dates:  Health Maintenance  Topic Date Due   COVID-19 Vaccine (6 - 2024-25 season) 05/02/2023   Hepatitis C Screening  08/27/2098*   Flu Shot  03/31/2024   Cologuard (Stool DNA test)  01/24/2025   Medicare Annual Wellness Visit  02/13/2025   Mammogram  04/20/2025   DEXA scan (bone density measurement)  10/08/2025   Pneumococcal Vaccine for age over 74  Completed   Zoster (Shingles) Vaccine  Completed   HPV Vaccine  Aged Out   Meningitis B Vaccine  Aged Out   DTaP/Tdap/Td vaccine  Discontinued   Colon Cancer Screening  Discontinued  *Topic was postponed. The date shown is not the original due date.    Advanced directives: (Copy Requested) Please bring a copy of your health care power of attorney and living will to the office to be added to your chart at your convenience. You can mail to [facility] [address] [place], [place] [address] or email to [email or web address] Advance Care Planning is important because  it:  [x]  Makes sure you receive the medical care that is consistent with your values, goals, and preferences  [x]  It provides guidance to your family and loved ones and reduces their decisional burden about whether or not they are making the right decisions based on your wishes.  Follow the link provided in your after visit summary or read over the paperwork we have mailed to you to help you started getting your Advance Directives in place. If you need assistance in completing these, please reach out to us  so that we can help you!  See attachments for Preventive Care and Fall Prevention Tips.

## 2024-02-14 NOTE — Progress Notes (Unsigned)
 Subjective:   Melinda Suarez is a 68 y.o. who presents for a Medicare Wellness preventive visit.  As a reminder, Annual Wellness Visits don't include a physical exam, and some assessments may be limited, especially if this visit is performed virtually. We may recommend an in-person follow-up visit with your provider if needed.  Visit Complete: Virtual I connected with  Melinda Suarez on 02/14/24 by a audio enabled telemedicine application and verified that I am speaking with the correct person using two identifiers.  Patient Location: Home  Provider Location: Home Office  I discussed the limitations of evaluation and management by telemedicine. The patient expressed understanding and agreed to proceed.  Vital Signs: Because this visit was a virtual/telehealth visit, some criteria may be missing or patient reported. Any vitals not documented were not able to be obtained and vitals that have been documented are patient reported.  VideoDeclined- This patient declined Librarian, academic. Therefore the visit was completed with audio only.  Persons Participating in Visit: Patient.  AWV Questionnaire: No: Patient Medicare AWV questionnaire was not completed prior to this visit.  Cardiac Risk Factors include: advanced age (>63men, >4 women);dyslipidemia;Other (see comment), Risk factor comments: glaucoma     Objective:    There were no vitals filed for this visit. There is no height or weight on file to calculate BMI.     02/14/2024   11:32 AM 03/15/2023    2:26 PM 02/08/2023   11:38 AM 04/29/2022   11:51 AM 01/16/2022   11:26 AM  Advanced Directives  Does Patient Have a Medical Advance Directive? Yes Yes Yes No Yes  Type of Advance Directive Living will Living will Healthcare Power of [place];Living will  Healthcare Power of Attorney  Copy of Healthcare Power of Attorney in Chart? No - copy requested  No - copy requested  No - copy requested  Would  patient like information on creating a medical advance directive?    No - Patient declined     Current Medications (verified) Outpatient Encounter Medications as of 02/14/2024  Medication Sig   alendronate (FOSAMAX) 70 MG tablet Take 70 mg by mouth once a week.   Calcium Carbonate-Vitamin D  (CALCIUM + D PO)     ESTRING 2 MG vaginal ring    latanoprost (XALATAN) 0.005 % ophthalmic solution SMARTSIG:In Eye(s)   Multiple Vitamins-Minerals (MULTIVITAMIN,TX-MINERALS) tablet Take 1 tablet by mouth daily.   SUMAtriptan  (IMITREX ) 100 MG tablet TAKE ONE TABLET BY MOUTH FOR migraine HEADACHE, MAY REPEAT in TWO hours if persistent. max 2/day   No facility-administered encounter medications on file as of 02/14/2024.    Allergies (verified) Depo-medrol [methylprednisolone], Elemental sulfur, Prednisone, Cephalosporins, and Penicillins   History: Past Medical History:  Diagnosis Date   Ganglion cyst    Migraine headache    Osteopenia    Past Surgical History:  Procedure Laterality Date   BREAST CYST ASPIRATION     benign in 2011   GANGLION CYST EXCISION     left hand; left handed   Family History  Problem Relation Age of Onset   Osteoporosis Mother    Arthritis Mother    Lung cancer Mother        died at 38. still driving and working at church   Kidney disease Father        amylodosis, died in 70s. Tear of aorta and was on blood thinners.    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 Baking.    Left handed       Lives with husband/2025   Social Drivers of Health   Financial Resource Strain: Low Risk  (02/14/2024)   Overall Financial Resource Strain (CARDIA)    Difficulty of Paying Living Expenses: Not hard at all  Food Insecurity: No Food Insecurity (02/14/2024)   Hunger Vital Sign    Worried About Running Out of Food in the Last Year: Never true    Ran Out of Food in the Last Year: Never true  Transportation Needs: No Transportation Needs (02/14/2024)   PRAPARE - Administrator, Civil Service (Medical): No    Lack of Transportation (Non-Medical): No  Physical Activity: Sufficiently Active (02/14/2024)   Exercise Vital Sign    Days of Exercise per Week: 7 days    Minutes of Exercise per Session: 30 min  Stress: No Stress Concern Present (02/14/2024)   [employer] of Occupational Health - Occupational Stress Questionnaire    Feeling of Stress: Not at all  Social Connections: Socially Integrated (02/14/2024)   Social Connection and Isolation Panel    Frequency of Communication with Friends and Family: More than three times a week    Frequency of Social Gatherings with Friends and Family: Once a week    Attends Religious Services: More than 4 times per year    Active Member of [employer] or Organizations: Yes    Attends Banker Meetings: Never    Marital Status: Married    Tobacco Counseling Counseling given: Not Answered    Clinical  Intake:  Pre-visit preparation completed: Yes  Pain : No/denies pain     Nutritional Risks: None Diabetes: No  No results found for: HGBA1C   How often do you need to have someone help you when you read instructions, pamphlets, or other written materials from your doctor or pharmacy?: 1 - Never  Interpreter Needed?: No  Information entered by :: Melinda Suarez, Melinda Suarez   Activities of Daily Living     02/14/2024   11:30 AM  In your present state of health, do you have any difficulty performing the following activities:  Hearing? 0  Vision? 0  Difficulty concentrating or making decisions? 0  Walking or climbing stairs? 0  Dressing or bathing?  0  Doing errands, shopping? 0  Preparing Food and eating ? N  Using the Toilet? N  In the past six months, have you accidently leaked urine? N  Do you have problems with loss of bowel control? N  Managing your Medications? N  Managing your Finances? N  Housekeeping or managing your Housekeeping? N    Patient Care Team: Almira Jaeger, MD as PCP - General (Family Medicine)  I have updated your Care Teams any recent Medical Services you may have received from other providers in the past year.     Assessment:   This is a routine wellness examination for Melinda Suarez.  Hearing/Vision screen Hearing Screening - Comments:: Denies hearing difficulties   Vision Screening - Comments:: Glaucoma/ Dr. Roslynn Coombes   Goals Addressed             This Visit's Progress    Patient Stated   On track    Eat right and exercise more        Depression Screen     02/14/2024   11:36 AM 02/08/2023   11:37 AM 01/21/2023    8:09 AM 01/16/2022   11:25 AM 01/14/2022    2:49 PM 10/14/2020    8:13 AM 10/05/2019    7:58 AM  PHQ 2/9 Scores  PHQ - 2 Score 0 0 0 0 0 0 0  PHQ- 9 Score 0 0 2  0  1    Fall Risk     02/14/2024   11:33 AM 03/15/2023    2:26 PM 02/05/2023    4:06 PM 01/21/2023    8:08 AM 01/16/2022   11:27 AM  Fall Risk   Falls in the past  year? 0 0 1 1 0  Number falls in past yr: 0 0 0 0 0  Injury with Fall? 0 0 1 1 0  Comment   both wrist in yoga class    Risk for fall due to :   History of fall(s);Impaired vision History of fall(s) Impaired vision  Follow up Falls evaluation completed;Falls prevention discussed Falls evaluation completed Falls prevention discussed Falls evaluation completed Falls prevention discussed      Data saved with a previous flowsheet row definition    MEDICARE RISK AT HOME:  Medicare Risk at Home Any stairs in or around the home?: Yes If so, are there any without handrails?: No Home free of loose throw rugs in walkways, pet beds, electrical cords, etc?: Yes Adequate lighting in your home to reduce risk of falls?: Yes Life alert?: No Use of a cane, walker or w/c?: No Grab bars in the bathroom?: No Shower chair or bench in shower?: No Elevated toilet seat or a handicapped toilet?: No  TIMED UP AND GO:  Was the test performed?  No  Cognitive Function: Declined/Normal: No cognitive concerns noted by patient or family. Patient alert, oriented, able to answer questions appropriately and recall recent events. No signs of memory loss or confusion.        01/16/2022   11:28 AM  6CIT Screen  What Year? 0 points  What month? 0 points  What time? 0 points  Count back from 20 0 points  Months in reverse 0 points  Repeat phrase 0 points  Total Score 0 points    Immunizations Immunization History  Administered Date(s) Administered   Influenza,inj,Quad PF,6+ Mos 06/13/2015, 09/21/2017, 06/20/2019   Influenza-Unspecified 06/07/2013, 06/30/2016, 08/02/2018, 05/31/2020, 06/28/2021   PFIZER Comirnaty(Gray Top)Covid-19 Tri-Sucrose Vaccine 07/02/2022   PFIZER(Purple Top)SARS-COV-2 Vaccination  11/20/2019, 12/11/2019, 08/01/2020   PNEUMOCOCCAL CONJUGATE-20 01/21/2023   Pfizer Covid-19 Vaccine Bivalent Booster 25yrs & up 09/12/2021   Td 09/01/2001   Tdap 06/02/2012   Zoster Recombinant(Shingrix)  12/01/2020, 05/15/2021    Screening Tests Health Maintenance  Topic Date Due   COVID-19 Vaccine (6 - 2024-25 season) 05/02/2023   Hepatitis C Screening  08/27/2098 (Originally 10/28/1973)   INFLUENZA VACCINE  03/31/2024   Fecal DNA (Cologuard)  01/24/2025   Medicare Annual Wellness (AWV)  02/13/2025   MAMMOGRAM  04/20/2025   DEXA SCAN  10/08/2025   Pneumococcal Vaccine: 50+ Years  Completed   Zoster Vaccines- Shingrix  Completed   HPV VACCINES  Aged Out   Meningococcal B Vaccine  Aged Out   DTaP/Tdap/Td  Discontinued   Colonoscopy  Discontinued    Health Maintenance  Health Maintenance Due  Topic Date Due   COVID-19 Vaccine (6 - 2024-25 season) 05/02/2023   Health Maintenance Items Addressed: See Nurse Notes at the end of this note  Additional Screening:  Vision Screening: Recommended annual ophthalmology exams for early detection of glaucoma and other disorders of the eye. Would you like a referral to an eye doctor? No    Dental Screening: Recommended annual dental exams for proper oral hygiene  Community Resource Referral / Chronic Care Management: CRR required this visit?  No   CCM required this visit?  No   Plan:    I have personally reviewed and noted the following in the patient's chart:   Medical and social history Use of alcohol, tobacco or illicit drugs  Current medications and supplements including opioid prescriptions. Patient is not currently taking opioid prescriptions. Functional ability and status Nutritional status Physical activity Advanced directives List of other physicians Hospitalizations, surgeries, and ER visits in previous 12 months Vitals Screenings to include cognitive, depression, and falls Referrals and appointments  In addition, I have reviewed and discussed with patient certain preventive protocols, quality metrics, and best practice recommendations. A written personalized care plan for preventive services as well as general  preventive health recommendations were provided to patient.   Melinda Suarez, Melinda Suarez   02/14/2024   After Visit Summary: (MyChart) Due to this being a telephonic visit, the after visit summary with patients personalized plan was offered to patient via MyChart   Notes: Please refer to Routing Comments.

## 2024-02-15 ENCOUNTER — Ambulatory Visit: Payer: Self-pay | Admitting: Family Medicine

## 2024-02-15 ENCOUNTER — Encounter: Payer: Self-pay | Admitting: Family Medicine

## 2024-02-18 ENCOUNTER — Other Ambulatory Visit: Payer: Self-pay | Admitting: Family Medicine

## 2024-02-28 ENCOUNTER — Encounter: Payer: Medicare PPO | Admitting: Family Medicine

## 2024-03-02 ENCOUNTER — Telehealth: Payer: Self-pay | Admitting: Family Medicine

## 2024-03-02 NOTE — Telephone Encounter (Addendum)
 LVM to schedule Medicare AWV for 01/2025

## 2024-04-12 NOTE — Progress Notes (Unsigned)
 [facility] [address] [place] [address] Phone: [phone] Subjective:   Melinda Suarez, am serving as a scribe for Dr. Arthea Claudene.  I'm seeing this patient by the request  of:  Katrinka Garnette KIDD, MD  CC: Left hand pain follow-up.  YEP:Dlagzrupcz  02/14/2024 Discussed HEP  Likely fibroma between the PIP and the MCP on the fifth finger, brace given, no abnormal blood flow noted.  We discussed warm compresses and home exercises, icing regimen, increase activity slowly.  If worsening pain would consider injections.  Could get x-rays to further evaluate for any bony abnormality.  Mild calcific changes noted at the MCP joint. Will continue to monitor and will follow-up again in 2 months to make sure no significant enlargement occurs.  Could consider potential injection.     Updated 02/19/2024 Melinda Suarez is a 68 y.o. female coming in with complaint of L hand pain. Patient states that knot has grown in size since last visit. No pain though.    X-rays of the left hand showed that there was some mild arthritic changes mostly of the [facility] joint and moderately over the scaphoid area and the distal radial aspect.   Saw an outside provider for her back pain and did have x-rays that did not show any significant bony abnormality.  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 Baking.    Left handed       Lives with husband/2025   Social Drivers of Health   Financial Resource Strain: Low Risk  (02/14/2024)   Overall Financial Resource Strain (CARDIA)    Difficulty of Paying Living Expenses: Not hard at all  Food Insecurity: No Food Insecurity (02/14/2024)   Hunger Vital Sign    Worried About Running Out of Food in the Last Year: Never true    Ran Out of Food in the Last Year: Never true  Transportation Needs: No Transportation Needs (02/14/2024)   PRAPARE - Administrator, Civil Service (Medical): No    Lack of Transportation (Non-Medical): No  Physical Activity: Sufficiently Active (02/14/2024)   Exercise Vital Sign    Days of Exercise per Week: 7  days    Minutes of Exercise per Session: 30 min  Stress: No Stress Concern Present (02/14/2024)   [employer] of Occupational Health - Occupational Stress Questionnaire    Feeling of Stress: Not at all  Social Connections: Socially Integrated (02/14/2024)   Social Connection and Isolation Panel    Frequency of Communication with Friends and Family: More than three times a week    Frequency of Social Gatherings with Friends and Family: Once a week    Attends Religious Services: More than 4 times per year    Active Member of [employer] or Organizations: Yes    Attends Banker Meetings: Never    Marital Status: Married   Allergies  Allergen Reactions   Depo-Medrol [Methylprednisolone] Hives   Elemental Sulfur    Prednisone  Hives   Cephalosporins Rash   Penicillins Rash   Family History  Problem Relation Age of Onset   Osteoporosis Mother    Arthritis Mother    Lung cancer Mother        died at 42. still driving and working at church   Kidney disease Father        amylodosis, died in 48s. Tear of aorta and was on blood thinners.    Asthma Brother    Heart disease Paternal Grandfather        42s   Stroke Paternal Grandfather    Diabetes Neg Hx    Hyperlipidemia Neg Hx    Hypertension Neg Hx    Cancer Neg Hx     Current Outpatient Medications (Endocrine & Metabolic):    alendronate (FOSAMAX) 70 MG tablet, Take 70 mg by mouth once a week.    Current Outpatient Medications (Analgesics):    SUMAtriptan  (IMITREX ) 100 MG tablet, TAKE ONE TABLET BY MOUTH FOR migraine HEADACHE, MAY REPEAT in TWO hours if persistent. max 2/day   Current Outpatient Medications (Other):    Calcium Carbonate-Vitamin D  (CALCIUM + D PO),     ESTRING 2 MG vaginal ring,    latanoprost (XALATAN) 0.005 % ophthalmic solution, SMARTSIG:In Eye(s)   Multiple Vitamins-Minerals (MULTIVITAMIN,TX-MINERALS) tablet, Take 1 tablet by mouth daily.   Reviewed prior external information including notes and imaging from  primary care provider As well as notes that were available from care everywhere and other healthcare systems.  Past medical history, social, surgical and family history all reviewed in electronic medical record.  No pertanent information unless stated regarding to the chief complaint.   Review of Systems:  No headache, visual changes, nausea, vomiting, diarrhea, constipation, dizziness, abdominal pain, skin rash, fevers, chills, night sweats, weight loss, swollen lymph nodes, body aches, joint swelling, chest pain, shortness of breath, mood changes. POSITIVE muscle aches  Objective  Blood pressure 106/68, pulse 85, height 5' 5 (1.651 m), weight 113 lb (51.3 kg), SpO2 96%.   General: No apparent distress alert and  oriented x3 mood and affect normal, dressed appropriately.  HEENT: Pupils equal, extraocular movements intact  Respiratory: Patient's speak in full sentences and does not appear short of breath  Cardiovascular: No lower extremity edema, non tender, no erythema  Antalgic gait noted with shuffling gait. Hand exam shows patient still has on the fifth finger on the left hand what appears to be of a fibroma.  There is seem to be somewhat associated with the flexor tendon sheath but difficult to assess.  Extra-articular for sure.  Does seem a little bigger than previous exam.  Limited muscular skeletal ultrasound was performed and interpreted by  CLAUDENE HUSSAR, M  Limited exam still shows the changes seems to be more of a heterotropic bone mass noted.  Seems to be fibrotic in nature.  No abnormal blood flow still noted.  Measures are greater than 1 cm at this time.  Extra-articular at this moment. Impression: Enlargement of likely fibroma.    Impression and Recommendations:    The above documentation has been reviewed and is accurate and complete Rockie Vawter M Bernardo Brayman, DO

## 2024-04-20 ENCOUNTER — Other Ambulatory Visit: Payer: Self-pay

## 2024-04-20 ENCOUNTER — Ambulatory Visit: Admitting: Family Medicine

## 2024-04-20 ENCOUNTER — Encounter: Payer: Self-pay | Admitting: Family Medicine

## 2024-04-20 VITALS — BP 106/68 | HR 85 | Ht 65.0 in | Wt 113.0 lb

## 2024-04-20 DIAGNOSIS — D2112 Benign neoplasm of connective and other soft tissue of left upper limb, including shoulder: Secondary | ICD-10-CM

## 2024-04-20 DIAGNOSIS — M79642 Pain in left hand: Secondary | ICD-10-CM

## 2024-04-20 NOTE — Assessment & Plan Note (Signed)
 Patient has but still seems to be more of a fibroma but has enlarged.  Still no abnormal vascular changes noted.  We discussed different treatment options including surveillance versus the possibility of injection versus discussing with a hand surgeon.  Patient would because it has grown and he would like to discuss with a hand surgeon at what surgery would look like as well as potential downtime.  Patient is in agreement with the plan.  We will refer her and can follow-up with me if she would like to continue conservative therapy after discussing with the surgeon

## 2024-04-20 NOTE — Patient Instructions (Addendum)
 Referral to Dr. Romona See me in 3-6 months if we do conservative tx

## 2024-04-26 DIAGNOSIS — Z1231 Encounter for screening mammogram for malignant neoplasm of breast: Secondary | ICD-10-CM | POA: Diagnosis not present

## 2024-04-26 LAB — HM MAMMOGRAPHY

## 2024-04-28 ENCOUNTER — Encounter: Payer: Self-pay | Admitting: Family Medicine

## 2024-05-02 DIAGNOSIS — M720 Palmar fascial fibromatosis [Dupuytren]: Secondary | ICD-10-CM | POA: Diagnosis not present

## 2024-05-02 DIAGNOSIS — R2232 Localized swelling, mass and lump, left upper limb: Secondary | ICD-10-CM | POA: Diagnosis not present

## 2024-05-03 ENCOUNTER — Encounter: Payer: Self-pay | Admitting: Family Medicine

## 2024-05-03 ENCOUNTER — Ambulatory Visit (INDEPENDENT_AMBULATORY_CARE_PROVIDER_SITE_OTHER): Payer: Medicare PPO | Admitting: Family Medicine

## 2024-05-03 VITALS — BP 92/62 | HR 64 | Temp 97.5°F | Ht 65.0 in | Wt 110.6 lb

## 2024-05-03 DIAGNOSIS — E785 Hyperlipidemia, unspecified: Secondary | ICD-10-CM | POA: Diagnosis not present

## 2024-05-03 DIAGNOSIS — Z0000 Encounter for general adult medical examination without abnormal findings: Secondary | ICD-10-CM

## 2024-05-03 DIAGNOSIS — G43C0 Periodic headache syndromes in child or adult, not intractable: Secondary | ICD-10-CM

## 2024-05-03 DIAGNOSIS — M810 Age-related osteoporosis without current pathological fracture: Secondary | ICD-10-CM

## 2024-05-03 LAB — COMPREHENSIVE METABOLIC PANEL WITH GFR
ALT: 25 U/L (ref 0–35)
AST: 30 U/L (ref 0–37)
Albumin: 4.2 g/dL (ref 3.5–5.2)
Alkaline Phosphatase: 55 U/L (ref 39–117)
BUN: 21 mg/dL (ref 6–23)
CO2: 31 meq/L (ref 19–32)
Calcium: 9.2 mg/dL (ref 8.4–10.5)
Chloride: 99 meq/L (ref 96–112)
Creatinine, Ser: 0.76 mg/dL (ref 0.40–1.20)
GFR: 80.46 mL/min (ref >60.00)
Glucose, Bld: 87 mg/dL (ref 70–99)
Potassium: 4.3 meq/L (ref 3.5–5.1)
Sodium: 139 meq/L (ref 135–145)
Total Bilirubin: 0.7 mg/dL (ref 0.2–1.2)
Total Protein: 7.3 g/dL (ref 6.0–8.3)

## 2024-05-03 LAB — LIPID PANEL
Cholesterol: 199 mg/dL (ref <200)
HDL: 79 mg/dL (ref >=50)
LDL Cholesterol (Calc): 104 mg/dL — ABNORMAL HIGH
Non-HDL Cholesterol (Calc): 120 mg/dL (ref <130)
Total CHOL/HDL Ratio: 2.5 (calc) (ref <5.0)
Triglycerides: 69 mg/dL (ref <150)

## 2024-05-03 LAB — CBC WITH DIFFERENTIAL/PLATELET
Basophils Absolute: 0 K/uL (ref 0.0–0.1)
Basophils Relative: 1.1 % (ref 0.0–3.0)
Eosinophils Absolute: 0.3 K/uL (ref 0.0–0.7)
Eosinophils Relative: 5.8 % — ABNORMAL HIGH (ref 0.0–5.0)
HCT: 37.7 % (ref 36.0–46.0)
Hemoglobin: 12.5 g/dL (ref 12.0–15.0)
Lymphocytes Relative: 31.1 % (ref 12.0–46.0)
Lymphs Abs: 1.5 K/uL (ref 0.7–4.0)
MCHC: 33.3 g/dL (ref 30.0–36.0)
MCV: 94.6 fl (ref 78.0–100.0)
Monocytes Absolute: 0.5 K/uL (ref 0.1–1.0)
Monocytes Relative: 9.9 % (ref 3.0–12.0)
Neutro Abs: 2.4 K/uL (ref 1.4–7.7)
Neutrophils Relative %: 52.1 % (ref 43.0–77.0)
Platelets: 396 K/uL (ref 150.0–400.0)
RBC: 3.98 Mil/uL (ref 3.87–5.11)
RDW: 13.9 % (ref 11.5–15.5)
WBC: 4.7 K/uL (ref 4.0–10.5)

## 2024-05-03 LAB — VITAMIN D 25 HYDROXY (VIT D DEFICIENCY, FRACTURES): VITD: 59.94 ng/mL (ref 30.00–100.00)

## 2024-05-03 NOTE — Progress Notes (Signed)
 Phone [phone]   Subjective:  Patient presents today for their annual physical. Chief complaint-noted.   See problem oriented charting- ROS- full  review of systems was completed and negative except for topics noted under acute/chronic concerns   The following were reviewed and entered/updated in epic: Past Medical History:  Diagnosis Date   Allergy list   Ganglion cyst    Glaucoma    Hyperlipidemia    Migraine headache    Osteopenia    Patient Active Problem List   Diagnosis Date Noted   Hyperlipidemia 07/18/2014    Priority: Medium    Osteoporosis 06/04/2014    Priority: Medium    Migraines 06/04/2014    Priority: Medium    Caregiver stress 08/20/2016    Priority: Low   Glaucoma 06/04/2014    Priority: Low   History of disease of skin and subcutaneous tissue 07/02/2007    Priority: Low   Fibroma of finger of left hand 02/14/2024   Left shoulder pain 12/05/2020   Other bursal cyst, right hand 04/18/2020   Past Surgical History:  Procedure Laterality Date   BREAST CYST ASPIRATION     benign in 2011   GANGLION CYST EXCISION     left hand; left handed    Family History  Problem Relation Age of Onset   Osteoporosis Mother    Arthritis Mother    Lung cancer Mother        died at 90. still driving and working at church   Cancer Mother    Kidney disease Father        amylodosis, died in 61s. Tear of aorta and was on blood thinners.    Asthma Brother    Asthma Brother    Dementia Maternal Grandmother        lived to 6   Stroke Maternal Grandfather        61s   Healthy Paternal Grandmother        lived to 74   Heart disease Paternal Grandfather        63s   Stroke Paternal Grandfather    Diabetes Neg Hx    Hyperlipidemia Neg Hx    Hypertension Neg Hx     Medications- reviewed and updated Current Outpatient Medications  Medication Sig Dispense Refill   alendronate (FOSAMAX) 70 MG tablet Take 70 mg by mouth once a week.     Calcium  Carbonate-Vitamin D  (CALCIUM + D PO)       ESTRING 2 MG vaginal ring      latanoprost (XALATAN) 0.005 % ophthalmic solution SMARTSIG:In Eye(s)     Multiple Vitamins-Minerals (MULTIVITAMIN,TX-MINERALS) tablet Take 1 tablet by mouth daily.     SUMAtriptan  (IMITREX ) 100 MG tablet TAKE ONE TABLET BY MOUTH FOR migraine HEADACHE, MAY REPEAT in TWO hours if persistent. max 2/day 8 tablet 11   No current facility-administered medications for this visit.    Allergies-reviewed and updated Allergies  Allergen Reactions   Depo-Medrol [Methylprednisolone] Hives   Elemental Sulfur    Prednisone Hives   Cephalosporins Rash   Penicillins Rash    Social History   Social History Narrative   Married 1980. 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       Hobbies: hike, walking, former runner, crafts, Archivist, house in [place]. Baking.    Left handed       Lives with husband/2025   Objective  Objective:  BP 92/62 (BP Location: Left Arm,  Patient Position: Sitting, Cuff Size: Normal)   Pulse 64   Temp (!) 97.5 F (36.4 C) (Temporal)   Ht 5' 5 (1.651 m)   Wt 110 lb 9.6 oz (50.2 kg)   SpO2 96%   BMI 18.40 kg/m  Gen: NAD, resting comfortably HEENT: Mucous membranes are moist. Oropharynx normal Neck: no thyromegaly CV: RRR no murmurs rubs or gallops Lungs: CTAB no crackles, wheeze, rhonchi Abdomen: soft/nontender/nondistended/normal bowel sounds. No rebound or guarding.  Ext: no edema Skin: warm, dry Neuro: grossly normal, moves all extremities, PERRLA   Assessment and Plan   68 y.o. female presenting for annual physical.  Health Maintenance counseling: 1. Anticipatory guidance: Patient counseled regarding regular dental exams -q6 months, eye exams-every 6 months due to glaucoma,  avoiding smoking and second hand smoke , limiting alcohol to 1 beverage per day- 1 a day or less- has cut back , no illicit drugs .   2. Risk factor reduction:  Advised  patient of need for regular exercise and diet rich and fruits and vegetables to reduce risk of heart attack and stroke.  Exercise-has maintained excellent job with walking- plus core and light weights.  Diet/weight management-has maintained weight but we have encouraged mild weight gain but she has been stable since 2016.  Wt Readings from Last 3 Encounters:  05/03/24 110 lb 9.6 oz (50.2 kg)  04/20/24 113 lb (51.3 kg)  02/14/24 114 lb (51.7 kg)  3. Immunizations/screenings/ancillary studies-recommended Tdap at pharmacy, plans on flu shot in October, consider COVID-19 vaccination- planning on this  Immunization History  Administered Date(s) Administered   Influenza,inj,Quad PF,6+ Mos 06/13/2015, 09/21/2017, 06/20/2019   Influenza-Unspecified 06/07/2013, 06/30/2016, 08/02/2018, 05/31/2020, 06/28/2021   PFIZER Comirnaty(Gray Top)Covid-19 Tri-Sucrose Vaccine 07/02/2022   PFIZER(Purple Top)SARS-COV-2 Vaccination 11/20/2019, 12/11/2019, 08/01/2020   PNEUMOCOCCAL CONJUGATE-20 01/21/2023   Pfizer Covid-19 Vaccine Bivalent Booster 44yrs & up 09/12/2021   Td 09/01/2001   Tdap 06/02/2012   Zoster Recombinant(Shingrix) 12/01/2020, 05/15/2021  4. Cervical cancer screening- past age based screening- recommendations for Pap smears.  Last on file 08/06/2017 HPV negative-she reports had one updated in 2024 5. Breast cancer screening-  breast exam with Dr. Babetta and mammogram -04/26/2024- good report  6. Colon cancer screening - Cologuard last completed 01/24/22 with 3 year repeat  7. Skin cancer screening- not seeing derm but considering still.  advised regular sunscreen use. Denies worrisome, changing, or new skin lesions.  8. Birth control/STD check- postmenopausal and monogamous  9. Osteoporosis screening at 64- see discussion below 10. Smoking associated screening - Never smoker  Status of chronic or acute concerns   #social update- may be going to [place] in the fall- with brother to look for wedding  venues for niece  #started seeing therapist- multiple stressors- husband, family, has not grieved yet for mother. Good fit so far.   # Left hand fibroma-enlarging and has referral to Dr. Romona to discuss potential surgery- thinks dupuytrens contracture- solution would still be surgery but wants to monitor- will monitor- still able to make fist   #Left eye twitches- ongoing issue and we have not found solution. Worse with laying down but eventually gets better- deep breaths and exhaling helps. Open mouth wide and stretching helps  # Osteoporosis- fracture August 2023 at work S: Last DEXA: -2.6 in right femoral neck February 2024 -10/07/2020 with GYN-statistically significant increase in bone density in 2 areas and stable and others  Medication (bisphosphonate or prolia): Restarted bisphosphonates 2024 through GYN-alendronate 70 mg weekly -Prior bisphosphonate- stopped around  2018 we believe- on for 5 years  Calcium: 1200mg  (through diet ok) recommended -patient takes Caltrate Vitamin D : 1000 units a day recommended-patient takes extra vitamin D -levels have been excellent Last vitamin D  Lab Results  Component Value Date   VD25OH 59.43 01/21/2023   A/P: osteoporosis suspect stable- continue current medications and update vitamin D    #hyperlipidemia S: Medication:None.  Lab Results  Component Value Date   CHOL 196 01/21/2023   HDL 79.80 01/21/2023   LDLCALC 102 (H) 01/21/2023   LDLDIRECT 95.7 12/16/2010   TRIG 73.0 01/21/2023   CHOLHDL 2 01/21/2023   The 10-year ASCVD risk score (Arnett DK, et al., 2019) is: 3.7%  A/P: 10 year arteriosclerotic cardiovascular disease risk is on low side- continue to monitor    #Migraines S: Patient continues to take approximately 10 Imitrex  per year -No changes in migraine pattern  A/P: doing well continue to monitor with sparing use   % Glaucoma-continue twice yearly follow-up with ophthalmology  Recommended follow up: Return in about 1 year  (around 05/03/2025) for physical or sooner if needed.Schedule b4 you leave.  Lab/Order associations: fasting   ICD-10-CM   1. Preventative health care  Z00.00     2. Age-related osteoporosis without current pathological fracture  M81.0 VITAMIN D  25 Hydroxy (Vit-D Deficiency, Fractures)    3. Hyperlipidemia, unspecified hyperlipidemia type  E78.5 CBC w/Diff    Comp Met (CMET)    Lipid panel    4. Periodic headache syndrome, not intractable  G43.C0       No orders of the defined types were placed in this encounter.   Return precautions advised.  Garnette Lukes, MD

## 2024-05-03 NOTE — Patient Instructions (Addendum)
 Tetanus, Diphtheria, and Pertussis (Tdap) at pharmacy  Please stop by lab before you go If you have mychart- we will send your results within 3 business days of us  receiving them.  If you do not have mychart- we will call you about results within 5 business days of us  receiving them.  *please also note that you will see labs on mychart as soon as they post. I will later go in and write notes on them- will say notes from Dr. Katrinka   Recommended follow up: Return in about 1 year (around 05/03/2025) for physical or sooner if needed.Schedule b4 you leave.

## 2024-05-04 ENCOUNTER — Ambulatory Visit: Payer: Self-pay | Admitting: Family Medicine

## 2024-05-31 ENCOUNTER — Other Ambulatory Visit: Payer: Self-pay | Admitting: Family Medicine

## 2024-08-02 DIAGNOSIS — H401232 Low-tension glaucoma, bilateral, moderate stage: Secondary | ICD-10-CM | POA: Diagnosis not present

## 2024-08-02 DIAGNOSIS — Z961 Presence of intraocular lens: Secondary | ICD-10-CM | POA: Diagnosis not present

## 2024-08-02 DIAGNOSIS — H04123 Dry eye syndrome of bilateral lacrimal glands: Secondary | ICD-10-CM | POA: Diagnosis not present

## 2024-09-02 ENCOUNTER — Other Ambulatory Visit: Payer: Self-pay | Admitting: Family Medicine

## 2025-05-08 ENCOUNTER — Encounter: Admitting: Family Medicine
